# Patient Record
Sex: Male | Born: 1980 | Race: White | Hispanic: No | Marital: Single | Smoking: Never smoker
Health system: Southern US, Community
[De-identification: ages and names within clinical notes are randomized; demographics above are authoritative.]

## PROBLEM LIST (undated history)

## (undated) DIAGNOSIS — K449 Diaphragmatic hernia without obstruction or gangrene: Principal | ICD-10-CM

## (undated) DIAGNOSIS — I10 Essential (primary) hypertension: Secondary | ICD-10-CM

## (undated) DIAGNOSIS — T7840XA Allergy, unspecified, initial encounter: Secondary | ICD-10-CM

## (undated) DIAGNOSIS — K5090 Crohn's disease, unspecified, without complications: Secondary | ICD-10-CM

## (undated) HISTORY — DX: Allergy, unspecified, initial encounter: T78.40XA

## (undated) HISTORY — PX: OTHER SURGICAL HISTORY: SHX169

## (undated) HISTORY — DX: Essential (primary) hypertension: I10

## (undated) HISTORY — DX: Diaphragmatic hernia without obstruction or gangrene: K44.9

---

## 2016-06-15 ENCOUNTER — Encounter (HOSPITAL_COMMUNITY): Payer: Self-pay | Admitting: Emergency Medicine

## 2016-06-15 ENCOUNTER — Observation Stay (HOSPITAL_COMMUNITY)
Admission: EM | Admit: 2016-06-15 | Discharge: 2016-06-18 | Disposition: A | Discharge status: Home / Self Care | Payer: BLUE CROSS/BLUE SHIELD | Attending: Internal Medicine | Admitting: Internal Medicine

## 2016-06-15 DIAGNOSIS — K5090 Crohn's disease, unspecified, without complications: Secondary | ICD-10-CM | POA: Diagnosis present

## 2016-06-15 DIAGNOSIS — Z887 Allergy status to serum and vaccine status: Secondary | ICD-10-CM | POA: Diagnosis not present

## 2016-06-15 DIAGNOSIS — E860 Dehydration: Secondary | ICD-10-CM | POA: Diagnosis present

## 2016-06-15 DIAGNOSIS — R197 Diarrhea, unspecified: Secondary | ICD-10-CM

## 2016-06-15 DIAGNOSIS — Z888 Allergy status to other drugs, medicaments and biological substances status: Secondary | ICD-10-CM | POA: Diagnosis not present

## 2016-06-15 DIAGNOSIS — R1110 Vomiting, unspecified: Secondary | ICD-10-CM

## 2016-06-15 HISTORY — DX: Crohn's disease, unspecified, without complications: K50.90

## 2016-06-15 LAB — CBC WITH DIFFERENTIAL/PLATELET
BASOS PCT: 0 %
Basophils Absolute: 0 10*3/uL (ref 0.0–0.1)
Eosinophils Absolute: 0 10*3/uL (ref 0.0–0.7)
Eosinophils Relative: 0 %
HEMATOCRIT: 56.3 % — AB (ref 39.0–52.0)
HEMOGLOBIN: 20.1 g/dL — AB (ref 13.0–17.0)
Lymphocytes Relative: 7 %
Lymphs Abs: 0.7 10*3/uL (ref 0.7–4.0)
MCH: 31.4 pg (ref 26.0–34.0)
MCHC: 35.7 g/dL (ref 30.0–36.0)
MCV: 88 fL (ref 78.0–100.0)
MONOS PCT: 14 %
Monocytes Absolute: 1.3 10*3/uL — ABNORMAL HIGH (ref 0.1–1.0)
NEUTROS ABS: 7.6 10*3/uL (ref 1.7–7.7)
NEUTROS PCT: 79 %
Platelets: 264 10*3/uL (ref 150–400)
RBC: 6.4 MIL/uL — ABNORMAL HIGH (ref 4.22–5.81)
RDW: 13.5 % (ref 11.5–15.5)
WBC: 9.6 10*3/uL (ref 4.0–10.5)

## 2016-06-15 LAB — COMPREHENSIVE METABOLIC PANEL
ALBUMIN: 5.3 g/dL — AB (ref 3.5–5.0)
ALT: 27 U/L (ref 17–63)
AST: 21 U/L (ref 15–41)
Alkaline Phosphatase: 63 U/L (ref 38–126)
Anion gap: 10 (ref 5–15)
BILIRUBIN TOTAL: 1.4 mg/dL — AB (ref 0.3–1.2)
BUN: 24 mg/dL — AB (ref 6–20)
CALCIUM: 9.5 mg/dL (ref 8.9–10.3)
CO2: 27 mmol/L (ref 22–32)
CREATININE: 1.12 mg/dL (ref 0.61–1.24)
Chloride: 102 mmol/L (ref 101–111)
GFR calc Af Amer: >60 mL/min (ref >60)
GFR calc non Af Amer: >60 mL/min (ref >60)
GLUCOSE: 136 mg/dL — AB (ref 65–99)
Potassium: 4 mmol/L (ref 3.5–5.1)
Sodium: 139 mmol/L (ref 135–145)
TOTAL PROTEIN: 9.1 g/dL — AB (ref 6.5–8.1)

## 2016-06-15 LAB — LIPASE, BLOOD: Lipase: 14 U/L (ref 11–51)

## 2016-06-15 LAB — SEDIMENTATION RATE: Sed Rate: 3 mm/hr (ref 0–16)

## 2016-06-15 MED ORDER — PROMETHAZINE HCL 25 MG PO TABS: 25.0000 mg | ORAL_TABLET | Freq: Four times a day (QID) | ORAL | 0 refills | Status: DC | PRN

## 2016-06-15 MED ORDER — SODIUM CHLORIDE 0.9 % IV BOLUS (SEPSIS)
1000.0000 mL | Freq: Once | INTRAVENOUS | Status: AC
  Administered 2016-06-16: 1000 mL via INTRAVENOUS

## 2016-06-15 MED ORDER — SODIUM CHLORIDE 0.9 % IV BOLUS (SEPSIS)
1000.0000 mL | Freq: Once | INTRAVENOUS | Status: AC
  Administered 2016-06-15: 1000 mL via INTRAVENOUS

## 2016-06-15 MED ORDER — PROMETHAZINE HCL 25 MG/ML IJ SOLN
25.0000 mg | Freq: Once | INTRAMUSCULAR | Status: AC
  Administered 2016-06-16: 25 mg via INTRAVENOUS
  Filled 2016-06-15: qty 1

## 2016-06-15 MED ORDER — ONDANSETRON HCL 4 MG/2ML IJ SOLN
4.0000 mg | Freq: Once | INTRAMUSCULAR | Status: AC
  Administered 2016-06-15: 4 mg via INTRAVENOUS
  Filled 2016-06-15: qty 2

## 2016-06-15 NOTE — ED Provider Notes (Signed)
[facility] DEPT Provider Note   CSN: [id number] Arrival date & time: 06/15/16  2027  By signing my name below, I, Neta Mends, attest that this documentation has been prepared under the direction and in the presence of Merrily Pew, MD . Electronically Signed: Neta Mends, ED Scribe. 06/15/2016. 11:55 PM.    History   Chief Complaint Chief Complaint  Patient presents with  . Emesis  . Diarrhea    The history is provided by the patient. No language interpreter was used.   HPI Comments:  Collin Coleman is a 36 y.o. male with PMHx of Crohn's disease who presents to the Emergency Department complaining of multiple episodes of vomiting/diarrhea x 4 days. Pt reports that 5 days ago he ate 4 [employer] cakes which gave him increased gas and diarrhea, and he ate 2 [employer] chicken sandwiches 2 days ago, and then the diarrhea worsened, and since yesterday all of his BMs have been pure liquid. He states that today he has been unable to eat without vomiting. He reports that having diarrhea often is normal due to his Crohn's disease, but that it has become more severe over the past several day. Pt complains of associated abdominal pain and distension that he states precedes the vomiting. Pt reports that he feels a subjective fever after vomiting. Pt has taken Zofran, Gas-X, and drank pedialyte and water without relief. Mom states that pt did not have enough saliva in his mouth to dissolve the Zofran. He states that it has been 16 years since his last significant Crohn's flare. Pt states that the only medication he has taken for Crohn's was prednisone and this was in 2001. Pt denies any other significant medical problems. Pt denies other associated symptoms.   Past Medical History:  Diagnosis Date  . Crohn disease ([place])     There are no active problems to display for this patient.   History reviewed. No pertinent surgical history.     Home Medications    Prior to Admission  medications   Medication Sig Start Date End Date Taking? Authorizing Provider  promethazine (PHENERGAN) 25 MG tablet Take 1 tablet (25 mg total) by mouth every 6 (six) hours as needed for nausea or vomiting. 06/15/16   Merrily Pew, MD    Family History History reviewed. No pertinent family history.  Social History Social History  Substance Use Topics  . Smoking status: Never Smoker  . Smokeless tobacco: Never Used  . Alcohol use No     Allergies   Benadryl [diphenhydramine] and Pertussis vaccines   Review of Systems Review of Systems  Constitutional: Positive for diaphoresis.  Gastrointestinal: Positive for abdominal distention, abdominal pain, diarrhea, nausea and vomiting.  All other systems reviewed and are negative.    Physical Exam Updated Vital Signs BP 143/95 (BP Location: Left Arm)   Pulse 119   Temp 99 F (37.2 C) (Oral)   Resp 18   Ht _0  (1.676 m)   Wt 175 lb (79.4 kg)   SpO2 95%   BMI 28.25 kg/m   Physical Exam  Constitutional: He is oriented to person, place, and time. He appears well-developed and well-nourished. No distress.  HENT:  Head: Normocephalic and atraumatic.  Eyes: Conjunctivae are normal.  Neck: Normal range of motion.  Cardiovascular: Regular rhythm and normal heart sounds.  Tachycardia present.  Exam reveals no gallop and no friction rub.   No murmur heard. Tachycardia  Pulmonary/Chest: Effort normal and breath sounds normal. No respiratory  distress. He has no wheezes. He has no rales. He exhibits no tenderness.  Abdominal: Bowel sounds are normal. He exhibits distension (mild). There is no rebound and no guarding.  Musculoskeletal: Normal range of motion. He exhibits no edema or deformity.  Neurological: He is alert and oriented to person, place, and time.  Skin: Skin is warm and dry. There is pallor.  Psychiatric: He has a normal mood and affect.  Nursing note and vitals reviewed.    ED Treatments / Results  DIAGNOSTIC  STUDIES:  Oxygen Saturation is 95% on RA, normal by my interpretation.    COORDINATION OF CARE:  9:32 PM Will provide Zofran and phenergan. Discussed treatment plan with pt at bedside and pt agreed to plan.   Labs (all labs ordered are listed, but only abnormal results are displayed) Labs Reviewed  CBC WITH DIFFERENTIAL/PLATELET - Abnormal; Notable for the following:       Result Value   RBC 6.40 (*)    Hemoglobin 20.1 (*)    HCT 56.3 (*)    Monocytes Absolute 1.3 (*)    All other components within normal limits  COMPREHENSIVE METABOLIC PANEL - Abnormal; Notable for the following:    Glucose, Bld 136 (*)    BUN 24 (*)    Total Protein 9.1 (*)    Albumin 5.3 (*)    Total Bilirubin 1.4 (*)    All other components within normal limits  SEDIMENTATION RATE  LIPASE, BLOOD  URINALYSIS, ROUTINE W REFLEX MICROSCOPIC    EKG  EKG Interpretation None       Radiology No results found.  Procedures Procedures (including critical care time)  Medications Ordered in ED Medications  promethazine (PHENERGAN) injection 25 mg (not administered)  sodium chloride 0.9 % bolus 1,000 mL (not administered)  sodium chloride 0.9 % bolus 1,000 mL (1,000 mLs Intravenous New Bag/Given 06/15/16 2157)  ondansetron [facility]) injection 4 mg (4 mg Intravenous Given 06/15/16 2157)     Initial Impression / Assessment and Plan / ED Course  I have reviewed the triage vital signs and the nursing notes.  Pertinent labs & imaging results that were available during my care of the patient were reviewed by me and considered in my medical decision making (see chart for details).     Dehydration likely 2/2 gastroenteritis. Doubt crohn's flare. No blood in diarrhea. No abdominal pain. No fevers. Normal wbc and esr.  Plan for hydration, vomiting control.   Final Clinical Impressions(s) / ED Diagnoses   Final diagnoses:  Dehydration  Vomiting, intractability of vomiting not specified, presence of nausea  not specified, unspecified vomiting type  Diarrhea, unspecified type    New Prescriptions New Prescriptions   PROMETHAZINE (PHENERGAN) 25 MG TABLET    Take 1 tablet (25 mg total) by mouth every 6 (six) hours as needed for nausea or vomiting.   I personally performed the services described in this documentation, which was scribed in my presence. The recorded information has been reviewed and is accurate.     Merrily Pew, MD 06/15/16 [phone]

## 2016-06-15 NOTE — ED Triage Notes (Signed)
Pt states that he has been having vomiting and diarrhea since Friday.  Has history of crohns

## 2016-06-15 NOTE — ED Notes (Signed)
Pt given water to drink. Pt experiencing some nausea but no vomiting at this time.

## 2016-06-15 NOTE — ED Notes (Signed)
Pt states he is unable to provide urine sample. States that he is "severely dehydrated." Pt requesting ice chips, and this tech was told by RN Cecille Rubin to provide them. Pt is now eating a few ice chips, and was informed to let this tech know when he is able to provide urine sample.

## 2016-06-15 NOTE — ED Notes (Addendum)
Pt given water by Dr. Dayna Barker

## 2016-06-16 ENCOUNTER — Emergency Department (HOSPITAL_COMMUNITY): Payer: BLUE CROSS/BLUE SHIELD

## 2016-06-16 ENCOUNTER — Encounter (HOSPITAL_COMMUNITY): Payer: Self-pay | Admitting: Family Medicine

## 2016-06-16 DIAGNOSIS — K5090 Crohn's disease, unspecified, without complications: Secondary | ICD-10-CM | POA: Diagnosis not present

## 2016-06-16 DIAGNOSIS — R197 Diarrhea, unspecified: Secondary | ICD-10-CM

## 2016-06-16 DIAGNOSIS — R1110 Vomiting, unspecified: Secondary | ICD-10-CM

## 2016-06-16 DIAGNOSIS — E860 Dehydration: Secondary | ICD-10-CM | POA: Diagnosis not present

## 2016-06-16 LAB — BASIC METABOLIC PANEL
ANION GAP: 6 (ref 5–15)
BUN: 16 mg/dL (ref 6–20)
CALCIUM: 7.6 mg/dL — AB (ref 8.9–10.3)
CO2: 23 mmol/L (ref 22–32)
CREATININE: 0.86 mg/dL (ref 0.61–1.24)
Chloride: 106 mmol/L (ref 101–111)
Glucose, Bld: 126 mg/dL — ABNORMAL HIGH (ref 65–99)
Potassium: 3.9 mmol/L (ref 3.5–5.1)
SODIUM: 135 mmol/L (ref 135–145)

## 2016-06-16 LAB — URINALYSIS, ROUTINE W REFLEX MICROSCOPIC
BACTERIA UA: NONE SEEN
BILIRUBIN URINE: NEGATIVE
Glucose, UA: NEGATIVE mg/dL
HGB URINE DIPSTICK: NEGATIVE
Ketones, ur: 5 mg/dL — AB
LEUKOCYTES UA: NEGATIVE
Nitrite: NEGATIVE
PROTEIN: 30 mg/dL — AB
SPECIFIC GRAVITY, URINE: 1.027 (ref 1.005–1.030)
pH: 5 (ref 5.0–8.0)

## 2016-06-16 LAB — CBC
HCT: 46.9 % (ref 39.0–52.0)
HEMOGLOBIN: 16.2 g/dL (ref 13.0–17.0)
MCH: 30.6 pg (ref 26.0–34.0)
MCHC: 34.5 g/dL (ref 30.0–36.0)
MCV: 88.5 fL (ref 78.0–100.0)
PLATELETS: 213 10*3/uL (ref 150–400)
RBC: 5.3 MIL/uL (ref 4.22–5.81)
RDW: 13.5 % (ref 11.5–15.5)
WBC: 5.8 10*3/uL (ref 4.0–10.5)

## 2016-06-16 MED ORDER — IOPAMIDOL (ISOVUE-300) INJECTION 61%: 100.0000 mL | Freq: Once | INTRAVENOUS | Status: DC | PRN

## 2016-06-16 MED ORDER — ONDANSETRON HCL 4 MG/2ML IJ SOLN: 4.0000 mg | Freq: Four times a day (QID) | INTRAMUSCULAR | Status: DC | PRN

## 2016-06-16 MED ORDER — METHYLPREDNISOLONE SODIUM SUCC 125 MG IJ SOLR
80.0000 mg | Freq: Once | INTRAMUSCULAR | Status: AC
  Administered 2016-06-16: 80 mg via INTRAVENOUS
  Filled 2016-06-16: qty 2

## 2016-06-16 MED ORDER — SODIUM CHLORIDE 0.9 % IV SOLN: INTRAVENOUS | Status: DC

## 2016-06-16 MED ORDER — METHYLPREDNISOLONE SODIUM SUCC 125 MG IJ SOLR
80.0000 mg | Freq: Two times a day (BID) | INTRAMUSCULAR | Status: DC
  Administered 2016-06-16 – 2016-06-17 (×3): 80 mg via INTRAVENOUS
  Filled 2016-06-16 (×3): qty 2

## 2016-06-16 MED ORDER — ONDANSETRON HCL 4 MG PO TABS: 4.0000 mg | ORAL_TABLET | Freq: Four times a day (QID) | ORAL | Status: DC | PRN

## 2016-06-16 MED ORDER — SODIUM CHLORIDE 0.9 % IV BOLUS (SEPSIS)
1000.0000 mL | Freq: Once | INTRAVENOUS | Status: AC
  Administered 2016-06-16: 1000 mL via INTRAVENOUS

## 2016-06-16 MED ORDER — KETOROLAC TROMETHAMINE 30 MG/ML IJ SOLN: 30.0000 mg | Freq: Four times a day (QID) | INTRAMUSCULAR | Status: DC | PRN

## 2016-06-16 MED ORDER — IOPAMIDOL (ISOVUE-300) INJECTION 61%
INTRAVENOUS | Status: AC
  Administered 2016-06-16: 100 mL
  Filled 2016-06-16: qty 30

## 2016-06-16 MED ORDER — SODIUM CHLORIDE 0.9 % IV SOLN
INTRAVENOUS | Status: AC
  Administered 2016-06-16 (×2): via INTRAVENOUS

## 2016-06-16 MED ORDER — PROMETHAZINE HCL 25 MG/ML IJ SOLN
12.5000 mg | Freq: Once | INTRAMUSCULAR | Status: AC
  Administered 2016-06-16: 12.5 mg via INTRAVENOUS
  Filled 2016-06-16: qty 1

## 2016-06-16 NOTE — H&P (Signed)
History and Physical    Collin Coleman [id number] DOB: 1980-08-31 DOA: 06/15/2016  PCP: No PCP Per Patient  Patient coming from: home  Chief Complaint:  Abdominal pain, n/v/d  HPI: Collin Coleman is a 36 y.o. male with medical history significant of  crohns disease diagnosed at age 19 years old , has not had a flare in over 16 years comes in with 5 days of progressive worsening abdominal cramps, diarrhea with vomiting.  He denies any fevers.  He has chronic diarrhea, but this volume has increased a lot with more cramping than usual.  He has had decreased po intake.  He has not been on steroids in over 16 years.  He follows a strict diet that he believes helps him a lot.  Pt found to have  crohns flare on ct scan and referred for admission for iv steroids.  Review of Systems: As per HPI otherwise 10 point review of systems negative.   Past Medical History:  Diagnosis Date  . Crohn disease ([place])     History reviewed. No pertinent surgical history.   reports that he has never smoked. He has never used smokeless tobacco. He reports that he does not drink alcohol or use drugs.  Allergies  Allergen Reactions  . Benadryl [Diphenhydramine]   . Pertussis Vaccines     History reviewed. No pertinent family history. no CAD  Prior to Admission medications   Medication Sig Start Date End Date Taking? Authorizing Provider  promethazine (PHENERGAN) 25 MG tablet Take 1 tablet (25 mg total) by mouth every 6 (six) hours as needed for nausea or vomiting. 06/15/16   Merrily Pew, MD    Physical Exam: Vitals:   06/15/16 2043 06/16/16 0039 06/16/16 0159 06/16/16 0449  BP:  137/95  129/81  Pulse:  106  90  Resp:  18  17  Temp:   98.9 F (37.2 C)   TempSrc:      SpO2:  98%  98%  Weight: 79.4 kg (175 lb)     Height: 5\' 6"  (1.676 m)      Constitutional: NAD, calm, comfortable Vitals:   06/15/16 2043 06/16/16 0039 06/16/16 0159 06/16/16 0449  BP:  137/95  129/81  Pulse:  106  90  Resp:  18  17    Temp:   98.9 F (37.2 C)   TempSrc:      SpO2:  98%  98%  Weight: 79.4 kg (175 lb)     Height: 5\' 6"  (1.676 m)      Eyes: PERRL, lids and conjunctivae normal ENMT: Mucous membranes are moist. Posterior pharynx clear of any exudate or lesions.Normal dentition.  Neck: normal, supple, no masses, no thyromegaly Respiratory: clear to auscultation bilaterally, no wheezing, no crackles. Normal respiratory effort. No accessory muscle use.  Cardiovascular: Regular rate and rhythm, no murmurs / rubs / gallops. No extremity edema. 2+ pedal pulses. No carotid bruits.  Abdomen: no tenderness, no masses palpated. No hepatosplenomegaly. Bowel sounds positive.  Musculoskeletal: no clubbing / cyanosis. No joint deformity upper and lower extremities. Good ROM, no contractures. Normal muscle tone.  Skin: no rashes, lesions, ulcers. No induration Neurologic: CN 2-12 grossly intact. Sensation intact, DTR normal. Strength 5/5 in all 4.  Psychiatric: Normal judgment and insight. Alert and oriented x 3. Normal mood.    Labs on Admission: I have personally reviewed following labs and imaging studies  CBC:  Recent Labs Lab 06/15/16 2157  WBC 9.6  NEUTROABS 7.6  HGB 20.1*  HCT 56.3*  MCV 88.0  PLT [id number]   Basic Metabolic Panel:  Recent Labs Lab 06/15/16 2157  NA 139  K 4.0  CL 102  CO2 27  GLUCOSE 136*  BUN 24*  CREATININE 1.12  CALCIUM 9.5   GFR: Estimated Creatinine Clearance: 89.4 mL/min (by C-G formula based on SCr of 1.12 mg/dL). Liver Function Tests:  Recent Labs Lab 06/15/16 2157  AST 21  ALT 27  ALKPHOS 63  BILITOT 1.4*  PROT 9.1*  ALBUMIN 5.3*    Recent Labs Lab 06/15/16 2157  LIPASE 14   Urine analysis:    Component Value Date/Time   COLORURINE YELLOW 06/16/2016 0100   APPEARANCEUR HAZY (A) 06/16/2016 0100   LABSPEC 1.027 06/16/2016 0100   PHURINE 5.0 06/16/2016 0100   GLUCOSEU NEGATIVE 06/16/2016 0100   HGBUR NEGATIVE 06/16/2016 0100   BILIRUBINUR NEGATIVE  06/16/2016 0100   KETONESUR 5 (A) 06/16/2016 0100   PROTEINUR 30 (A) 06/16/2016 0100   NITRITE NEGATIVE 06/16/2016 0100   LEUKOCYTESUR NEGATIVE 06/16/2016 0100   Radiological Exams on Admission: Ct Abdomen Pelvis W Contrast  Result Date: 06/16/2016 CLINICAL DATA:  Initial evaluation for acute right lower quadrant pain. History of Crohn's disease. EXAM: CT ABDOMEN AND PELVIS WITH CONTRAST TECHNIQUE: Multidetector CT imaging of the abdomen and pelvis was performed using the standard protocol following bolus administration of intravenous contrast. CONTRAST:  179mL ISOVUE-300 IOPAMIDOL (ISOVUE-300) INJECTION 61% COMPARISON:  None available. FINDINGS: Lower chest: Minimal subsegmental atelectasis present dependently within the lung bases. Visualized lungs are otherwise clear. Hepatobiliary: Liver within normal limits. Gallbladder normal. No biliary dilatation. Pancreas: Pancreas within normal limits. Spleen: Spleen within normal limits. Adrenals/Urinary Tract: Adrenal glands are normal. Subcentimeter hypodensity within the lower pole left kidney noted, too small the characterize, but statistically likely reflects a small cyst. Kidneys otherwise unremarkable without evidence for nephrolithiasis, hydronephrosis, or focal enhancing renal mass. No hydroureter. Bladder within normal limits. Stomach/Bowel: Small amount of enteric contrast material present within the distal esophageal lumen. Stomach within normal limits. Multiple mildly prominent fluid-filled loops of small bowel seen scattered throughout the abdomen without evidence for obstruction. There is abnormal wall thickening with mucosal enhancement inflammatory stranding about a short segment of the terminal ileum as it courses into the ileocecal valve (series 5, image 44). Findings compatible with acute enteritis, most likely related to an acute Crohn's flare. Appendix is well-visualized posteriorly and is normal in appearance without evidence for acute  appendicitis. Scattered intraluminal fluid density throughout the colon, likely related to diarrheal illness. No other significant inflammatory changes about the colon. Vascular/Lymphatic: Normal intravascular enhancement seen throughout the intra-abdominal aorta and its branch vessels. No adenopathy. Reproductive: Prostate normal. Other: No free intraperitoneal air.  No free fluid. Musculoskeletal: No acute osseous abnormality. Mild compression deformity of the T8 vertebral body noted, chronic in appearance. No worrisome lytic or blastic osseous lesions. IMPRESSION: 1. Wall thickening with mucosal enhancement and inflammatory stranding about the terminal ileum, most consistent with acute enteritis. This is most likely inflammatory in nature related to an acute Crohn's flare. No complication identified. 2. Intraluminal fluid density throughout the colon, compatible with associated/concomitant diarrheal illness. 3. Remote compression deformity of the T8 vertebral body. Electronically Signed   By: Jeannine Boga M.D.   On: 06/16/2016 04:18    Assessment/Plan 36 yo male with acute crohns flare and dehydration  Principal Problem:   Crohn disease ([place])- iv solumedrol 80mg  iv q 12 hours while nauseas, advance and switch to po once tolerating po  well  Active Problems:   Dehydration- ivf    DVT prophylaxis: scds  Code Status:  full Family Communication: none  Disposition Plan:  Per day team Consults called:  none Admission status:  observation   Czarina Gingras A MD [facility]  If 7PM-7AM, please contact night-coverage [email or web address] Password [facility]  06/16/2016, 4:51 AM

## 2016-06-16 NOTE — ED Notes (Signed)
[place]-mother-[phone]   Call mother to advise what time pt may be admitted.

## 2016-06-16 NOTE — Progress Notes (Signed)
06/16/16  0900  Patient arrived to the floor A&O x 4, in stable condition, and IV saline lock.

## 2016-06-16 NOTE — ED Notes (Signed)
Pt requested and given ice chips

## 2016-06-16 NOTE — ED Notes (Signed)
Pt and family updated, tolerating po fluids well,

## 2016-06-16 NOTE — ED Provider Notes (Signed)
Assumed care from Dr. Dayna Barker at [facility].  Patient with hx Crohn's, not on medications, presenting with 4 days of vomiting and diarrhea.  No fever. Unable to tolerate PO today.  Receiving IV and PO hydration.  Patient with persistent pain and nausea and dry heaving as well as RLQ pain. CT scan obtained which shows ileitis likely consistent with Crohn's exacerbation. We'll start steroids.  Patient still nauseated and dry heaving and doesn't feel like he can go home. Observation admission discussed with Dr. Shanon Brow.    Ezequiel Essex, MD 06/16/16 [phone]

## 2016-06-16 NOTE — Progress Notes (Signed)
Seen and examined. Agree with Dr. Camelia Eng admission note.  Will continue to monitor.  Patient asking for soft diet (particularly would like chocolate pudding or pasta).  Soft diet placed.

## 2016-06-17 DIAGNOSIS — R112 Nausea with vomiting, unspecified: Secondary | ICD-10-CM | POA: Diagnosis not present

## 2016-06-17 DIAGNOSIS — E860 Dehydration: Secondary | ICD-10-CM | POA: Diagnosis not present

## 2016-06-17 DIAGNOSIS — K5090 Crohn's disease, unspecified, without complications: Secondary | ICD-10-CM | POA: Diagnosis not present

## 2016-06-17 MED ORDER — PREDNISONE 20 MG PO TABS
40.0000 mg | ORAL_TABLET | Freq: Every day | ORAL | Status: DC
  Administered 2016-06-18: 40 mg via ORAL
  Filled 2016-06-17: qty 2

## 2016-06-17 NOTE — Progress Notes (Signed)
PROGRESS NOTE    Collin Coleman  [id number] DOB: 07-28-1980 DOA: 06/15/2016 PCP: No PCP Per Patient   Brief Narrative:  Collin Coleman is a 36 y.o. male with medical history significant of  crohns disease diagnosed at age 50 years old , has not had a flare in over 16 years comes in with 5 days of progressive worsening abdominal cramps, diarrhea with vomiting.  He denies any fevers.  He has chronic diarrhea, but this volume has increased a lot with more cramping than usual.  He has had decreased po intake.  He has not been on steroids in over 16 years.  He follows a strict diet that he believes helps him a lot.  Pt found to have  crohns flare on ct scan and referred for admission for iv steroids.   Assessment & Plan:   Principal Problem:   Crohn's disease without complication ([place]) Active Problems:   Dehydration   Vomiting   Crohn disease ([place]) - iv solumedrol 80mg  iv q 12 hours to transition to PO tomorrow - patient tolerated soft diet - will advance to regular diet as patient would like pasta  Dehydration - d/c IVF as patient is tolerating PO currently   DVT prophylaxis: scds  Code Status:  full Family Communication: none  Disposition Plan:  likely d/c in am   Consultants:   none  Procedures:   none  Antimicrobials:   none    Subjective: Patient reports some minimal abdominal pain overnight but says he was able to eat breakfast this am without problem.  He feels like he is willing to try a regular diet.  Discussed transitioning over to PO steroids and he feels like he is concerned about tolerating the diet and also tolerating his medication.  Objective: Vitals:   06/16/16 0900 06/16/16 1546 06/16/16 2100 06/17/16 0500  BP:  120/75 128/80 121/72  Pulse:  84 68 80  Resp: 20 20 18 18   Temp: 98.6 F (37 C) 98 F (36.7 C) 98.5 F (36.9 C) 97.6 F (36.4 C)  TempSrc: Oral Oral Oral Axillary  SpO2: 98% 99% 99% 97%  Weight: 77.1 kg (170 lb)     Height: 5\' 6"   (1.676 m)       Intake/Output Summary (Last 24 hours) at 06/17/16 1441 Last data filed at 06/17/16 1300  Gross per 24 hour  Intake             1730 ml  Output                0 ml  Net             1730 ml   Filed Weights   06/15/16 2043 06/16/16 0900  Weight: 79.4 kg (175 lb) 77.1 kg (170 lb)    Examination:  General exam: Appears calm and comfortable  Respiratory system: Clear to auscultation. Respiratory effort normal. Cardiovascular system: S1 & S2 heard, RRR. No JVD, murmurs, rubs, gallops or clicks. No pedal edema. Gastrointestinal system: Abdomen is nondistended, soft and nontender. No organomegaly or masses felt. Normal bowel sounds heard. Central nervous system: Alert and oriented. No focal neurological deficits. Extremities: Symmetric 5 x 5 power. Skin: No rashes, lesions or ulcers Psychiatry: Judgement and insight appear normal. Mood & affect appropriate.     Data Reviewed: I have personally reviewed following labs and imaging studies  CBC:  Recent Labs Lab 06/15/16 2157 06/16/16 0858  WBC 9.6 5.8  NEUTROABS 7.6  --   HGB  20.1* 16.2  HCT 56.3* 46.9  MCV 88.0 88.5  PLT 264 [id number]   Basic Metabolic Panel:  Recent Labs Lab 06/15/16 2157 06/16/16 0858  NA 139 135  K 4.0 3.9  CL 102 106  CO2 27 23  GLUCOSE 136* 126*  BUN 24* 16  CREATININE 1.12 0.86  CALCIUM 9.5 7.6*   GFR: Estimated Creatinine Clearance: 114.9 mL/min (by C-G formula based on SCr of 0.86 mg/dL). Liver Function Tests:  Recent Labs Lab 06/15/16 2157  AST 21  ALT 27  ALKPHOS 63  BILITOT 1.4*  PROT 9.1*  ALBUMIN 5.3*    Recent Labs Lab 06/15/16 2157  LIPASE 14   No results for input(s): AMMONIA in the last 168 hours. Coagulation Profile: No results for input(s): INR, PROTIME in the last 168 hours. Cardiac Enzymes: No results for input(s): CKTOTAL, CKMB, CKMBINDEX, TROPONINI in the last 168 hours. BNP (last 3 results) No results for input(s): PROBNP in the last 8760  hours. HbA1C: No results for input(s): HGBA1C in the last 72 hours. CBG: No results for input(s): GLUCAP in the last 168 hours. Lipid Profile: No results for input(s): CHOL, HDL, LDLCALC, TRIG, CHOLHDL, LDLDIRECT in the last 72 hours. Thyroid Function Tests: No results for input(s): TSH, T4TOTAL, FREET4, T3FREE, THYROIDAB in the last 72 hours. Anemia Panel: No results for input(s): VITAMINB12, FOLATE, FERRITIN, TIBC, IRON, RETICCTPCT in the last 72 hours. Sepsis Labs: No results for input(s): PROCALCITON, LATICACIDVEN in the last 168 hours.  No results found for this or any previous visit (from the past 240 hour(s)).       Radiology Studies: Ct Abdomen Pelvis W Contrast  Result Date: 06/16/2016 CLINICAL DATA:  Initial evaluation for acute right lower quadrant pain. History of Crohn's disease. EXAM: CT ABDOMEN AND PELVIS WITH CONTRAST TECHNIQUE: Multidetector CT imaging of the abdomen and pelvis was performed using the standard protocol following bolus administration of intravenous contrast. CONTRAST:  126mL ISOVUE-300 IOPAMIDOL (ISOVUE-300) INJECTION 61% COMPARISON:  None available. FINDINGS: Lower chest: Minimal subsegmental atelectasis present dependently within the lung bases. Visualized lungs are otherwise clear. Hepatobiliary: Liver within normal limits. Gallbladder normal. No biliary dilatation. Pancreas: Pancreas within normal limits. Spleen: Spleen within normal limits. Adrenals/Urinary Tract: Adrenal glands are normal. Subcentimeter hypodensity within the lower pole left kidney noted, too small the characterize, but statistically likely reflects a small cyst. Kidneys otherwise unremarkable without evidence for nephrolithiasis, hydronephrosis, or focal enhancing renal mass. No hydroureter. Bladder within normal limits. Stomach/Bowel: Small amount of enteric contrast material present within the distal esophageal lumen. Stomach within normal limits. Multiple mildly prominent fluid-filled  loops of small bowel seen scattered throughout the abdomen without evidence for obstruction. There is abnormal wall thickening with mucosal enhancement inflammatory stranding about a short segment of the terminal ileum as it courses into the ileocecal valve (series 5, image 44). Findings compatible with acute enteritis, most likely related to an acute Crohn's flare. Appendix is well-visualized posteriorly and is normal in appearance without evidence for acute appendicitis. Scattered intraluminal fluid density throughout the colon, likely related to diarrheal illness. No other significant inflammatory changes about the colon. Vascular/Lymphatic: Normal intravascular enhancement seen throughout the intra-abdominal aorta and its branch vessels. No adenopathy. Reproductive: Prostate normal. Other: No free intraperitoneal air.  No free fluid. Musculoskeletal: No acute osseous abnormality. Mild compression deformity of the T8 vertebral body noted, chronic in appearance. No worrisome lytic or blastic osseous lesions. IMPRESSION: 1. Wall thickening with mucosal enhancement and inflammatory stranding about the terminal ileum,  most consistent with acute enteritis. This is most likely inflammatory in nature related to an acute Crohn's flare. No complication identified. 2. Intraluminal fluid density throughout the colon, compatible with associated/concomitant diarrheal illness. 3. Remote compression deformity of the T8 vertebral body. Electronically Signed   By: Jeannine Boga M.D.   On: 06/16/2016 04:18        Scheduled Meds: . [START ON 06/18/2016] predniSONE  40 mg Oral Q breakfast   Continuous Infusions:   LOS: 0 days    Time spent: 25 minutes    Loretha Stapler, MD [facility] Pager [phone]  If 7PM-7AM, please contact night-coverage [email or web address] Password [facility] 06/17/2016, 2:41 PM

## 2016-06-18 DIAGNOSIS — K5090 Crohn's disease, unspecified, without complications: Secondary | ICD-10-CM

## 2016-06-18 MED ORDER — PROMETHAZINE HCL 25 MG PO TABS: 25.0000 mg | ORAL_TABLET | Freq: Four times a day (QID) | ORAL | 0 refills | Status: DC | PRN

## 2016-06-18 MED ORDER — PREDNISONE 10 MG PO TABS: ORAL_TABLET | ORAL | 0 refills | Status: DC

## 2016-06-18 MED ORDER — PANTOPRAZOLE SODIUM 40 MG PO TBEC: 40.0000 mg | DELAYED_RELEASE_TABLET | Freq: Every day | ORAL | 1 refills | Status: DC

## 2016-06-18 NOTE — Discharge Instructions (Signed)
Follow with GI in 2 weeks  Get CBC, CMP,  checked  by Primary MD next visit.    Activity: As tolerated   Disposition Home   Diet: Low-residue, soft diet   On your next visit with your primary care physician please Get Medicines reviewed and adjusted.   Please request your Prim.MD to go over all Hospital Tests and Procedure/Radiological results at the follow up, please get all Hospital records sent to your Prim MD by signing hospital release before you go home.   If you experience worsening of your admission symptoms, develop shortness of breath, life threatening emergency, suicidal or homicidal thoughts you must seek medical attention immediately by calling 911 or calling your MD immediately  if symptoms less severe.  You Must read complete instructions/literature along with all the possible adverse reactions/side effects for all the Medicines you take and that have been prescribed to you. Take any new Medicines after you have completely understood and accpet all the possible adverse reactions/side effects.   Do not drive, operating heavy machinery, perform activities at heights, swimming or participation in water activities or provide baby sitting services if your were admitted for syncope or siezures until you have seen by Primary MD or a Neurologist and advised to do so again.  Do not drive when taking Pain medications.    Do not take more than prescribed Pain, Sleep and Anxiety Medications  Special Instructions: If you have smoked or chewed Tobacco  in the last 2 yrs please stop smoking, stop any regular Alcohol  and or any Recreational drug use.  Wear Seat belts while driving.   Please note  You were cared for by a hospitalist during your hospital stay. If you have any questions about your discharge medications or the care you received while you were in the hospital after you are discharged, you can call the unit and asked to speak with the hospitalist on call if the  hospitalist that took care of you is not available. Once you are discharged, your primary care physician will handle any further medical issues. Please note that NO REFILLS for any discharge medications will be authorized once you are discharged, as it is imperative that you return to your primary care physician (or establish a relationship with a primary care physician if you do not have one) for your aftercare needs so that they can reassess your need for medications and monitor your lab values.

## 2016-06-18 NOTE — Discharge Summary (Signed)
Collin Coleman, is a 36 y.o. male  DOB Apr 06, 1981  MRN [id number].  Admission date:  06/15/2016  Admitting Physician  Phillips Grout, MD  Discharge Date:  06/18/2016   Primary MD  No PCP Per Patient  Recommendations for primary care physician for things to follow:  - Patient to follow with GI , Dr. Laural Golden will schedule an appointment in 2 weeks from discharge.   Admission Diagnosis  Dehydration [E86.0] Exacerbation of Crohn's disease without complication (HCC) XX123456 Diarrhea, unspecified type [R19.7] Vomiting, intractability of vomiting not specified, presence of nausea not specified, unspecified vomiting type [R11.10] Crohn's disease without complication, unspecified gastrointestinal tract location [facility]) [K50.90]   Discharge Diagnosis  Dehydration [E86.0] Exacerbation of Crohn's disease without complication ([place]) XX123456 Diarrhea, unspecified type [R19.7] Vomiting, intractability of vomiting not specified, presence of nausea not specified, unspecified vomiting type [R11.10] Crohn's disease without complication, unspecified gastrointestinal tract location ([place]) [K50.90]    Principal Problem:   Crohn's disease without complication ([place]) Active Problems:   Dehydration   Vomiting      Past Medical History:  Diagnosis Date  . Crohn disease ([place])     History reviewed. No pertinent surgical history.     History of present illness and  Hospital Course:     Kindly see H&P for history of present illness and admission details, please review complete Labs, Consult reports and Test reports for all details in brief  HPI  from the history and physical done on the day of admission 06/16/2016 HPI: Collin Coleman is a 36 y.o. male with medical history significant of  crohns disease diagnosed at age 31 years old , has not had a flare in over 16 years comes in with 5 days of progressive worsening abdominal  cramps, diarrhea with vomiting.  He denies any fevers.  He has chronic diarrhea, but this volume has increased a lot with more cramping than usual.  He has had decreased po intake.  He has not been on steroids in over 16 years.  He follows a strict diet that he believes helps him a lot.  Pt found to have  crohns flare on ct scan and referred for admission for iv steroids.   Hospital Course   Crohn disease [facility]) - Patient reports last flare 16 years ago, treated with IV Solu-Medrol during hospital stay, tolerated by mouth diet good, so he was advanced, tolerating by mouth date of her last 24 hours, denies any nausea or vomiting, abdominal pain significantly subsided, no further diarrhea, most recent bowel movement was Monday evening, he is more concerned about constipation than diarrhea currently, benign abdominal exam, transitioned to by mouth prednisone today 40 mg oral daily, with recommendation for gradual taper by 5 mg every week, discussed with Dr. Melony Overly, patient to start following with him as an outpatient.   Dehydration - Resolve with IV fluids, tolerating by mouth intake.     Discharge Condition:  Stable   Follow UP  Follow-up Information    Hildred Laser, MD Follow  up.   Specialty:  Gastroenterology Why:  You will be called with an appointment in 2 weeks Contact information: [place] [address] [phone]             Discharge Instructions  and  Discharge Medications     Allergies as of 06/18/2016      Reactions   Benadryl [diphenhydramine]    Pertussis Vaccines       Medication List    TAKE these medications   pantoprazole 40 MG tablet Commonly known as:  PROTONIX Take 1 tablet (40 mg total) by mouth daily.   predniSONE 10 MG tablet Commonly known as:  DELTASONE Please take 40 mg oral daily for 1 week, then taper by 5 mg every week, until you are seen by GI physician regarding further recommendation   promethazine 25 MG  tablet Commonly known as:  PHENERGAN Take 1 tablet (25 mg total) by mouth every 6 (six) hours as needed for nausea or vomiting.   Vitamin D (Ergocalciferol) 50000 units Caps capsule Commonly known as:  DRISDOL Take 50,000 Units by mouth 2 (two) times a week.         Diet and Activity recommendation: See Discharge Instructions above   Consults obtained -  None  Major procedures and Radiology Reports - PLEASE review detailed and final reports for all details, in brief -     Ct Abdomen Pelvis W Contrast  Result Date: 06/16/2016 CLINICAL DATA:  Initial evaluation for acute right lower quadrant pain. History of Crohn's disease. EXAM: CT ABDOMEN AND PELVIS WITH CONTRAST TECHNIQUE: Multidetector CT imaging of the abdomen and pelvis was performed using the standard protocol following bolus administration of intravenous contrast. CONTRAST:  15mL ISOVUE-300 IOPAMIDOL (ISOVUE-300) INJECTION 61% COMPARISON:  None available. FINDINGS: Lower chest: Minimal subsegmental atelectasis present dependently within the lung bases. Visualized lungs are otherwise clear. Hepatobiliary: Liver within normal limits. Gallbladder normal. No biliary dilatation. Pancreas: Pancreas within normal limits. Spleen: Spleen within normal limits. Adrenals/Urinary Tract: Adrenal glands are normal. Subcentimeter hypodensity within the lower pole left kidney noted, too small the characterize, but statistically likely reflects a small cyst. Kidneys otherwise unremarkable without evidence for nephrolithiasis, hydronephrosis, or focal enhancing renal mass. No hydroureter. Bladder within normal limits. Stomach/Bowel: Small amount of enteric contrast material present within the distal esophageal lumen. Stomach within normal limits. Multiple mildly prominent fluid-filled loops of small bowel seen scattered throughout the abdomen without evidence for obstruction. There is abnormal wall thickening with mucosal enhancement inflammatory  stranding about a short segment of the terminal ileum as it courses into the ileocecal valve (series 5, image 44). Findings compatible with acute enteritis, most likely related to an acute Crohn's flare. Appendix is well-visualized posteriorly and is normal in appearance without evidence for acute appendicitis. Scattered intraluminal fluid density throughout the colon, likely related to diarrheal illness. No other significant inflammatory changes about the colon. Vascular/Lymphatic: Normal intravascular enhancement seen throughout the intra-abdominal aorta and its branch vessels. No adenopathy. Reproductive: Prostate normal. Other: No free intraperitoneal air.  No free fluid. Musculoskeletal: No acute osseous abnormality. Mild compression deformity of the T8 vertebral body noted, chronic in appearance. No worrisome lytic or blastic osseous lesions. IMPRESSION: 1. Wall thickening with mucosal enhancement and inflammatory stranding about the terminal ileum, most consistent with acute enteritis. This is most likely inflammatory in nature related to an acute Crohn's flare. No complication identified. 2. Intraluminal fluid density throughout the colon, compatible with associated/concomitant diarrheal illness. 3. Remote  compression deformity of the T8 vertebral body. Electronically Signed   By: Jeannine Boga M.D.   On: 06/16/2016 04:18    Micro Results    No results found for this or any previous visit (from the past 240 hour(s)).     Today   Subjective:   Collin Coleman today has no headache,no chest  pain,no new weakness tingling or numbness, feels much better  Today. No further diarrhea, last bowel movement was Monday evening, reports abdominal pain significantly subsided, he is tolerating soft diet  Objective:   Blood pressure 99/60, pulse (!) 50, temperature 97.9 F (36.6 C), temperature source Oral, resp. rate 15, height 5\' 6"  (1.676 m), weight 77.1 kg (170 lb), SpO2 98 %.   Intake/Output  Summary (Last 24 hours) at 06/18/16 1320 Last data filed at 06/18/16 0700  Gross per 24 hour  Intake              240 ml  Output                0 ml  Net              240 ml    Exam Awake Alert, Oriented x 3, No new F.N deficits, Normal affect [place].AT,PERRAL Supple Neck,No JVD, No cervical lymphadenopathy appriciated.  Symmetrical Chest wall movement, Good air movement bilaterally, CTAB RRR,No Gallops,Rubs or new Murmurs, No Parasternal Heave +ve B.Sounds, Abd Soft, Non tender, No rebound -guarding or rigidity. No Cyanosis, Clubbing or edema, No new Rash or bruise  Data Review   CBC w Diff:  Lab Results  Component Value Date   WBC 5.8 06/16/2016   HGB 16.2 06/16/2016   HCT 46.9 06/16/2016   PLT 213 06/16/2016   LYMPHOPCT 7 06/15/2016   MONOPCT 14 06/15/2016   EOSPCT 0 06/15/2016   BASOPCT 0 06/15/2016    CMP:  Lab Results  Component Value Date   NA 135 06/16/2016   K 3.9 06/16/2016   CL 106 06/16/2016   CO2 23 06/16/2016   BUN 16 06/16/2016   CREATININE 0.86 06/16/2016   PROT 9.1 (H) 06/15/2016   ALBUMIN 5.3 (H) 06/15/2016   BILITOT 1.4 (H) 06/15/2016   ALKPHOS 63 06/15/2016   AST 21 06/15/2016   ALT 27 06/15/2016  .   Total Time in preparing paper work, data evaluation and todays exam - 35 minutes  ELGERGAWY, DAWOOD M.D on 06/18/2016 at 1:20 PM  [facility]   Office  [phone]

## 2016-06-18 NOTE — Progress Notes (Signed)
Patient discharged home.  IV removed - WNL.  Reviewed DC instructions and medications.  Patient able to voice diet restrictions to prevent further flare of Chron's disease.  GI to call for follow up. Patient verbalizes understanding. Assisted off unit by NT in NAD.

## 2016-06-18 NOTE — Care Management Note (Signed)
Case Management Note  Patient Details  Name: Collin Coleman MRN: [id number] Date of Birth: 1981-03-12  Subjective/Objective:                  Pt admitted with chrohns. He is from home, ind with ADL's. He has transportation and no difficulty affording or managing medications. He has PCP, cynthia butler, and but her practice has been put on hold. Pt given list of PCP accepting new pt's. Pt will r/u with GI.   Action/Plan: Pt discharging home today with self care. No further CM needs.   Expected Discharge Date:  06/19/16               Expected Discharge Plan:  Home/Self Care  In-House Referral:  NA  Discharge planning Services  CM Consult  Post Acute Care Choice:  NA Choice offered to:  NA  Status of Service:  Completed, signed off  Sherald Barge, RN 06/18/2016, 11:24 AM

## 2016-06-26 ENCOUNTER — Encounter: Payer: Self-pay | Admitting: Internal Medicine

## 2016-07-01 ENCOUNTER — Ambulatory Visit (INDEPENDENT_AMBULATORY_CARE_PROVIDER_SITE_OTHER): Payer: BLUE CROSS/BLUE SHIELD | Admitting: Internal Medicine

## 2016-07-01 ENCOUNTER — Telehealth (INDEPENDENT_AMBULATORY_CARE_PROVIDER_SITE_OTHER): Payer: Self-pay | Admitting: Internal Medicine

## 2016-07-01 ENCOUNTER — Telehealth (INDEPENDENT_AMBULATORY_CARE_PROVIDER_SITE_OTHER): Payer: Self-pay | Admitting: *Deleted

## 2016-07-01 NOTE — Telephone Encounter (Signed)
Patient was given an appointment to come in on Terri Setzer's schedule for today and have Dr. Laural Golden come over for the appointment as well.  A message was left on the patient's answering machine.  The patient called back after we had closed early due to inclement weather and stated that the appointment for 07/01/16 doesn't meet his schedule, unfortunately I know yall are only open one day a week and he has to work.  He stated that he also has an appointment scheduled with Dr. Santo Held Rourk's office.  He appreciates [place] getting him in, but the last he heard, we couldn't get him in until July, so he made other alternative physician's arrangements.  The message was forwarded to [place] for review.  The patient was told that we were watching for an opening to get him in as Dr. Laural Golden wanted to see him within two weeks, not that he couldn't be seen until July.

## 2016-07-01 NOTE — Telephone Encounter (Signed)
This gentlemen did not listen to our receptionist.   He was told that we would be calling to get him in the week of 3/12 per NUR request and we were watching his schedule to get him in on Tuesday or Thursday since he would be a work in that week.  As of 3/12 we didn't have a cancellation and we made decision to try to get him in on Terri's schedule with NUR walking over unless he ended up with cancellation due to weather.  He was told she would be calling him this week and he decided to go elsewhere which is his choice.    Facts need to be correct and we are not open one day a week--we are open 4 1/2 days a week and only NUR in one day and sometimes Thursday afternoon if schedule permits from hospital.

## 2016-07-07 ENCOUNTER — Ambulatory Visit (INDEPENDENT_AMBULATORY_CARE_PROVIDER_SITE_OTHER): Payer: BLUE CROSS/BLUE SHIELD | Admitting: Gastroenterology

## 2016-07-07 ENCOUNTER — Encounter: Payer: Self-pay | Admitting: Gastroenterology

## 2016-07-07 ENCOUNTER — Other Ambulatory Visit: Payer: Self-pay

## 2016-07-07 VITALS — BP 145/86 | HR 75 | Temp 97.6°F | Ht 66.0 in | Wt 183.8 lb

## 2016-07-07 DIAGNOSIS — R1011 Right upper quadrant pain: Secondary | ICD-10-CM

## 2016-07-07 DIAGNOSIS — K50119 Crohn's disease of large intestine with unspecified complications: Secondary | ICD-10-CM

## 2016-07-07 DIAGNOSIS — K5090 Crohn's disease, unspecified, without complications: Secondary | ICD-10-CM | POA: Diagnosis not present

## 2016-07-07 MED ORDER — PEG 3350-KCL-NA BICARB-NACL 420 G PO SOLR: 4000.0000 mL | ORAL | 0 refills | Status: DC

## 2016-07-07 MED ORDER — PREDNISONE 10 MG PO TABS: ORAL_TABLET | ORAL | 0 refills | Status: DC

## 2016-07-07 MED ORDER — PROMETHAZINE HCL 25 MG PO TABS: 25.0000 mg | ORAL_TABLET | Freq: Four times a day (QID) | ORAL | 3 refills | Status: DC | PRN

## 2016-07-07 MED ORDER — PANTOPRAZOLE SODIUM 40 MG PO TBEC: 40.0000 mg | DELAYED_RELEASE_TABLET | Freq: Every day | ORAL | 3 refills | Status: DC

## 2016-07-07 NOTE — Patient Instructions (Signed)
Please have blood work done today. We have scheduled you for an ultrasound of your gallbladder.   We have also scheduled you for a colonoscopy with Dr. Gala Romney in the near future.   We will see you in 3 months!

## 2016-07-07 NOTE — Progress Notes (Signed)
Primary Care Physician:  No PCP Per Patient Primary Gastroenterologist:  Dr. Gala Romney   Chief Complaint  Patient presents with  . Crohn's Disease  . Abdominal Pain    HPI:   Collin Coleman is a 36 y.o. male presenting today at the request of Forestine Na to establish care with a history of Crohn's disease, recently inpatient with a flare. CT 2/26 with wall thickening and mucosal enhancement and inflammatory stranding about the TI, most consistent with enteritis. Placed on IV steroids and then transitioned to oral Prednisone 40 mg daily with a gradual taper by 5 mg every week.   Originally diagnosed at age 57 by Dr. Oletta Lamas, then saw Dr. Gala Romney at age 65. We do not have any records.  No long-term therapy and states he was told to take Pentasa but felt he didn't want to at age 67. Watches what he eats and his stress and feels like he has managed well without taking anything. Went to the ED 2/25 due to acute on chronic diarrhea, N/V. Couldn't keep anything down.   Normal baseline is diarrhea perhaps 2-3 times per week. Normally with formed stool. No rectal bleeding. No therapy since 2001. Notes a constant RUQ dull pain that waxes and wanes in intensity since leaving the hospital. Phenergan helps. Placed on Protonix with prednisone. On Prednisone 30 now, has taken 40 for a week, then 35 for a week, now down to 30. No NSAIDs.     Past Medical History:  Diagnosis Date  . Crohn disease [facility])     Past Surgical History:  Procedure Laterality Date  . None      Current Outpatient Prescriptions  Medication Sig Dispense Refill  . pantoprazole (PROTONIX) 40 MG tablet Take 1 tablet (40 mg total) by mouth daily. 30 minutes prior to breakfast 90 tablet 3  . promethazine (PHENERGAN) 25 MG tablet Take 1 tablet (25 mg total) by mouth every 6 (six) hours as needed for nausea or vomiting. 60 tablet 3  . Vitamin D, Ergocalciferol, (DRISDOL) 50000 units CAPS capsule Take 50,000 Units by mouth 2 (two) times a  week.    . polyethylene glycol-electrolytes (TRILYTE) 420 g solution Take 4,000 mLs by mouth as directed. 4000 mL 0  . predniSONE (DELTASONE) 10 MG tablet Take 3 tablets for 6 days, then 2.5 tablets for 7 days, then 2 tablets for 7 days, then 1.5 tablets for 7 days, then 1 tablet for 7 days, then 1/2 tablet for 7 days and done. 75 tablet 0   No current facility-administered medications for this visit.     Allergies as of 07/07/2016 - Review Complete 07/07/2016  Allergen Reaction Noted  . Benadryl [diphenhydramine]  06/15/2016  . Pertussis vaccines  06/15/2016    Family History  Problem Relation Age of Onset  . Crohn's disease Neg Hx     no first degree relatives but states his great-great grandfather  . Colon cancer Neg Hx     Social History   Social History  . Marital status: Single    Spouse name: N/A  . Number of children: N/A  . Years of education: N/A   Occupational History  . Not on file.   Social History Main Topics  . Smoking status: Never Smoker  . Smokeless tobacco: Never Used  . Alcohol use No  . Drug use: No  . Sexual activity: Not on file   Other Topics Concern  . Not on file   Social History Narrative  .  No narrative on file    Review of Systems: As mentioned in HPI   Physical Exam: BP (!) 145/86   Pulse 75   Temp 97.6 F (36.4 C) (Oral)   Ht 5\' 6"  (1.676 m)   Wt 183 lb 12.8 oz (83.4 kg)   BMI 29.67 kg/m  General:   Alert and oriented. Pleasant and cooperative. Well-nourished and well-developed.  Head:  Normocephalic and atraumatic. Eyes:  Without icterus, sclera clear and conjunctiva pink.  Ears:  Normal auditory acuity. Nose:  No deformity, discharge,  or lesions. Lungs:  Clear to auscultation bilaterally. No wheezes, rales, or rhonchi. No distress.  Heart:  S1, S2 present without murmurs appreciated.  Abdomen:  +BS, soft, mild TTP RUQ and non-distended. No HSM noted. No guarding or rebound. No masses appreciated.  Rectal:  Deferred    Msk:  Symmetrical without gross deformities. Normal posture. Extremities:  Without  edema. Neurologic:  Alert and  oriented x4;  grossly normal neurologically. Psych:  Alert and cooperative. Normal mood and affect.  Lab Results  Component Value Date   WBC 5.8 06/16/2016   HGB 16.2 06/16/2016   HCT 46.9 06/16/2016   MCV 88.5 06/16/2016   PLT 213 06/16/2016   Lab Results  Component Value Date   ALT 27 06/15/2016   AST 21 06/15/2016   ALKPHOS 63 06/15/2016   BILITOT 1.4 (H) 06/15/2016

## 2016-07-07 NOTE — Care Management (Signed)
CM contacted by pt concerning his steroids, he will run out of Rx tomorrow and does not have appointment to see GI until 07/16/2016, office is not willing to refill rx until pt seen. [facility] office called by CM and they have open slot today at 130. Pt make aware and will make every effort to get to appointment today at 1:30pm.

## 2016-07-07 NOTE — Assessment & Plan Note (Signed)
36 year old male diagnosed at age 62 per his report, and he states he was seen at our practice at age 75. At that time, he reports being prescribed Pentasa; however, he did not want to take anything long-term and has been without any maintenance therapy since 2001. Recently inpatient for what was felt to be a Crohn's flare, with CT noting wall thickening and mucosal enhancement and inflammatory stranding about the TI. He was given IV steroids and transitioned to prednisone. He is on a slow taper and desires to taper down by 5 mg a week. Currently, he has gone from 40 mg several weeks ago to now 30. He is quite hesitant to pursue any long-term medication, but I discussed with him the importance of a colonoscopy for diagnostic purposes and evaluation of disease endoscopically. Clinically, he is back to his baseline of diarrhea 2-3 times a week, other normal BMs. No rectal bleeding or anemia noted. Will continue prednisone taper and pursue colonoscopy in about 4 weeks.   Proceed with TCS with Dr. Gala Romney in near future: the risks, benefits, and alternatives have been discussed with the patient in detail. The patient states understanding and desires to proceed. Phenergan 25 mg IV on call Prednisone taper prescribed

## 2016-07-07 NOTE — Assessment & Plan Note (Addendum)
New onset since leaving the hospital. Pursue gallbladder ultrasound and recheck HFP. Further recommendations to follow. Continue Protonix in setting of steroid therapy.   ADDENDUM 4/18: US abdomen with gallbladder polyp. Will need serial ultrasounds. HFP normal. RUQ pain is intermittent, sometimes associated with nausea, worsening by NOT eating. Sometimes movement exacerbates. Likely musculoskeletal component. Discussed adding EGD at time of colonoscopy, which patient is agreeable to. Would then pursue HIDA if EGD negative. Patient aware regarding risks and benefits.  Proceed with upper endoscopy in the near future with Dr. Gala Romney. The risks, benefits, and alternatives have been discussed in detail with patient. They have stated understanding and desire to proceed.  Phenergan 25 mg IV on call

## 2016-07-08 LAB — HEPATIC FUNCTION PANEL
ALBUMIN: 4 g/dL (ref 3.6–5.1)
ALT: 22 U/L (ref 9–46)
AST: 13 U/L (ref 10–40)
Alkaline Phosphatase: 34 U/L — ABNORMAL LOW (ref 40–115)
BILIRUBIN DIRECT: 0.1 mg/dL (ref <=0.2)
Indirect Bilirubin: 0.5 mg/dL (ref 0.2–1.2)
TOTAL PROTEIN: 6 g/dL — AB (ref 6.1–8.1)
Total Bilirubin: 0.6 mg/dL (ref 0.2–1.2)

## 2016-07-08 NOTE — Progress Notes (Signed)
No pcp per patient 

## 2016-07-11 ENCOUNTER — Ambulatory Visit (HOSPITAL_COMMUNITY)
Admission: RE | Admit: 2016-07-11 | Discharge: 2016-07-11 | Disposition: A | Discharge status: Home / Self Care | Payer: BLUE CROSS/BLUE SHIELD | Source: Ambulatory Visit | Attending: Gastroenterology | Admitting: Gastroenterology

## 2016-07-11 DIAGNOSIS — K824 Cholesterolosis of gallbladder: Secondary | ICD-10-CM | POA: Insufficient documentation

## 2016-07-11 DIAGNOSIS — K760 Fatty (change of) liver, not elsewhere classified: Secondary | ICD-10-CM | POA: Insufficient documentation

## 2016-07-11 DIAGNOSIS — R1011 Right upper quadrant pain: Secondary | ICD-10-CM | POA: Diagnosis present

## 2016-07-14 NOTE — Progress Notes (Signed)
LFTs are normal. [place] with fatty liver and gallbladder polyp. This will need to be followed every 6 months. If persistent RUQ pain, needs HIDA.

## 2016-07-15 ENCOUNTER — Encounter: Payer: Self-pay | Admitting: Family Medicine

## 2016-07-15 ENCOUNTER — Ambulatory Visit (INDEPENDENT_AMBULATORY_CARE_PROVIDER_SITE_OTHER): Payer: BLUE CROSS/BLUE SHIELD | Admitting: Family Medicine

## 2016-07-15 VITALS — BP 138/100 | HR 88 | Temp 98.9°F | Resp 18 | Ht 66.0 in | Wt 187.0 lb

## 2016-07-15 DIAGNOSIS — K7581 Nonalcoholic steatohepatitis (NASH): Secondary | ICD-10-CM | POA: Diagnosis not present

## 2016-07-15 DIAGNOSIS — Z8241 Family history of sudden cardiac death: Secondary | ICD-10-CM | POA: Insufficient documentation

## 2016-07-15 DIAGNOSIS — E559 Vitamin D deficiency, unspecified: Secondary | ICD-10-CM | POA: Diagnosis not present

## 2016-07-15 DIAGNOSIS — R197 Diarrhea, unspecified: Secondary | ICD-10-CM

## 2016-07-15 DIAGNOSIS — K5090 Crohn's disease, unspecified, without complications: Secondary | ICD-10-CM

## 2016-07-15 LAB — CBC WITH DIFFERENTIAL/PLATELET
BASOS ABS: 0 {cells}/uL (ref 0–200)
Basophils Relative: 0 %
EOS ABS: 160 {cells}/uL (ref 15–500)
Eosinophils Relative: 2 %
HEMATOCRIT: 50.4 % — AB (ref 38.5–50.0)
HEMOGLOBIN: 17.1 g/dL (ref 13.2–17.1)
LYMPHS ABS: 1360 {cells}/uL (ref 850–3900)
Lymphocytes Relative: 17 %
MCH: 30.8 pg (ref 27.0–33.0)
MCHC: 33.9 g/dL (ref 32.0–36.0)
MCV: 90.6 fL (ref 80.0–100.0)
MONO ABS: 960 {cells}/uL — AB (ref 200–950)
MPV: 8.6 fL (ref 7.5–12.5)
Monocytes Relative: 12 %
NEUTROS PCT: 69 %
Neutro Abs: 5520 cells/uL (ref 1500–7800)
Platelets: 165 10*3/uL (ref 140–400)
RBC: 5.56 MIL/uL (ref 4.20–5.80)
RDW: 14.8 % (ref 11.0–15.0)
WBC: 8 10*3/uL (ref 3.8–10.8)

## 2016-07-15 NOTE — Patient Instructions (Signed)
Continue to drink lots of water Follow your diet and medications Need labs today I will send you a letter with your test results.  If there is anything of concern, we will call right away.  See me in 3-4 weeks Need to re check your blood pressure

## 2016-07-15 NOTE — Progress Notes (Signed)
Chief Complaint  Patient presents with  . Establish Care   New to establish Patient is here with his mother He is a pleasant 36 year old gentleman with a history of Crohn's disease. He hasn't been active since his childhood, but flared up recently. He was hospitalized with vomiting diarrhea and dehydration. He responded to fluids and steroids. He has seen GI in follow-up. He is scheduled for colonoscopy. He is currently on a prednisone taper. He uses Protonix daily. He takes Phenergan as needed. His only other medication is vitamin D, needed for a history of vitamin D deficiency. His only concern is right upper quadrant pain. This is been present ever since his hospitalization. It waxes and wanes but never goes away. It is never severe. It is not related to food. It is not related to bowels. No fever or chills. When the pain is severe he'll have nausea. He had an ultrasound of his abdomen which showed a fatty liver and gallbladder polyp, otherwise negative. He states the pain is worse when his stomach is empty, and feels better when he's eaten. He does not have any heartburn or acid reflux symptoms.    Patient Active Problem List   Diagnosis Date Noted  . NASH (nonalcoholic steatohepatitis) 08-08-2016  . Vitamin D deficiency 08-08-16  . Family history of death due to heart problem at 52 years of age or younger 2016/08/08  . RUQ pain 07/07/2016  . Crohn's disease without complication ([place])   . Diarrhea     Outpatient Encounter Prescriptions as of 08-08-16  Medication Sig  . pantoprazole (PROTONIX) 40 MG tablet Take 1 tablet (40 mg total) by mouth daily. 30 minutes prior to breakfast  . predniSONE (DELTASONE) 10 MG tablet Take 3 tablets for 6 days, then 2.5 tablets for 7 days, then 2 tablets for 7 days, then 1.5 tablets for 7 days, then 1 tablet for 7 days, then 1/2 tablet for 7 days and done.  . promethazine (PHENERGAN) 25 MG tablet Take 1 tablet (25 mg total) by mouth every 6 (six)  hours as needed for nausea or vomiting.  . Vitamin D, Ergocalciferol, (DRISDOL) 50000 units CAPS capsule Take 50,000 Units by mouth 2 (two) times a week.  . [DISCONTINUED] polyethylene glycol-electrolytes (TRILYTE) 420 g solution Take 4,000 mLs by mouth as directed. (Patient not taking: Reported on 2016/08/08)   No facility-administered encounter medications on file as of 08-08-2016.     Past Medical History:  Diagnosis Date  . Allergy   . Crohn disease ([place])   . Hypertension     Past Surgical History:  Procedure Laterality Date  . None      Social History   Social History  . Marital status: Single    Spouse name: N/A  . Number of children: 0  . Years of education: 14   Occupational History  . Science writer General   Social History Main Topics  . Smoking status: Never Smoker  . Smokeless tobacco: Never Used  . Alcohol use No  . Drug use: No  . Sexual activity: Not Currently   Other Topics Concern  . Not on file   Social History Narrative   Lives with Collin Coleman and her husband   Enjoy Geneticist, [employer]    Family History  Problem Relation Age of Onset  . Arthritis Mother   . Asthma Mother   . Depression Mother   . Heart disease Mother 69    CABG  . Hyperlipidemia  Mother   . Hypertension Mother   . Stroke Mother   . Diabetes Father   . Heart disease Father 12    Heart attack  . Heart disease Maternal Grandmother   . Heart disease Paternal Grandfather 32  . Crohn's disease Neg Hx     no first degree relatives but states his great-great grandfather  . Colon cancer Neg Hx     Review of Systems  Constitutional: Negative for chills, fever and weight loss.  HENT: Negative for congestion and hearing loss.   Eyes: Negative for blurred vision and pain.  Respiratory: Negative for cough and shortness of breath.   Cardiovascular: Negative for chest pain and leg swelling.  Gastrointestinal: Positive for abdominal pain. Negative for  constipation, diarrhea and heartburn.  Genitourinary: Negative for dysuria and frequency.  Musculoskeletal: Negative for falls, joint pain and myalgias.  Neurological: Negative for dizziness, seizures and headaches.  Psychiatric/Behavioral: Negative for depression. The patient is not nervous/anxious and does not have insomnia.     BP (!) 138/100 (BP Location: Right Arm, Patient Position: Sitting, Cuff Size: Normal)   Pulse 88   Temp 98.9 F (37.2 C) (Temporal)   Resp 18   Ht 5\' 6"  (1.676 m)   Wt 187 lb 0.6 oz (84.8 kg)   SpO2 99%   BMI 30.19 kg/m   Physical Exam  Constitutional: He is oriented to person, place, and time. He appears well-developed and well-nourished.  HENT:  Head: Normocephalic and atraumatic.  Right Ear: External ear normal.  Left Ear: External ear normal.  Mouth/Throat: Oropharynx is clear and moist.  Eyes: Conjunctivae are normal. Pupils are equal, round, and reactive to light.  Neck: Normal range of motion. Neck supple. No thyromegaly present.  Cardiovascular: Normal rate, regular rhythm and normal heart sounds.   Pulmonary/Chest: Effort normal and breath sounds normal. No respiratory distress.  Abdominal: Soft. Bowel sounds are normal. There is tenderness.  Minimal tenderness right upper quadrant. No guarding or rebound. No palpable mass. No organomegaly.  Musculoskeletal: Normal range of motion. He exhibits no edema.  Lymphadenopathy:    He has no cervical adenopathy.  Neurological: He is alert and oriented to person, place, and time. He displays normal reflexes.  Gait normal  Skin: Skin is warm and dry.  Psychiatric: He has a normal mood and affect. His behavior is normal. Thought content normal.  Nursing note and vitals reviewed.  ASSESSMENT/PLAN:  1. NASH (nonalcoholic steatohepatitis)   2. Crohn's disease without complication, unspecified gastrointestinal tract location ([place]) - CBC with Differential/Platelet - COMPLETE METABOLIC PANEL WITH  GFR - HIV antibody - Lipid panel - Urinalysis, Routine w reflex microscopic - VITAMIN D 25 Hydroxy (Vit-D Deficiency, Fractures) - Vitamin B12  3. Vitamin D deficiency  4. Family history of death due to heart problem at 33 years of age or younger  59. Diarrhea, unspecified type    Patient Instructions  Continue to drink lots of water Follow your diet and medications Need labs today I will send you a letter with your test results.  If there is anything of concern, we will call right away.  See me in 3-4 weeks Need to re check your blood pressure   Raylene Everts, MD

## 2016-07-16 ENCOUNTER — Ambulatory Visit: Payer: BLUE CROSS/BLUE SHIELD | Admitting: Gastroenterology

## 2016-07-16 LAB — LIPID PANEL
CHOLESTEROL: 168 mg/dL (ref <200)
HDL: 63 mg/dL (ref >40)
LDL CALC: 87 mg/dL (ref <100)
Total CHOL/HDL Ratio: 2.7 Ratio (ref <5.0)
Triglycerides: 90 mg/dL (ref <150)
VLDL: 18 mg/dL (ref <30)

## 2016-07-16 LAB — COMPLETE METABOLIC PANEL WITH GFR
ALBUMIN: 3.9 g/dL (ref 3.6–5.1)
ALT: 23 U/L (ref 9–46)
AST: 14 U/L (ref 10–40)
Alkaline Phosphatase: 35 U/L — ABNORMAL LOW (ref 40–115)
BILIRUBIN TOTAL: 1.1 mg/dL (ref 0.2–1.2)
BUN: 16 mg/dL (ref 7–25)
CALCIUM: 9.1 mg/dL (ref 8.6–10.3)
CO2: 28 mmol/L (ref 20–31)
CREATININE: 0.87 mg/dL (ref 0.60–1.35)
Chloride: 99 mmol/L (ref 98–110)
GFR, Est African American: >89 mL/min (ref >=60)
Glucose, Bld: 75 mg/dL (ref 65–99)
POTASSIUM: 4 mmol/L (ref 3.5–5.3)
Sodium: 139 mmol/L (ref 135–146)
TOTAL PROTEIN: 6.2 g/dL (ref 6.1–8.1)

## 2016-07-16 LAB — URINALYSIS, ROUTINE W REFLEX MICROSCOPIC
BILIRUBIN URINE: NEGATIVE
Glucose, UA: NEGATIVE
Hgb urine dipstick: NEGATIVE
Ketones, ur: NEGATIVE
LEUKOCYTES UA: NEGATIVE
Nitrite: NEGATIVE
PROTEIN: NEGATIVE
SPECIFIC GRAVITY, URINE: 1.022 (ref 1.001–1.035)
pH: 7 (ref 5.0–8.0)

## 2016-07-16 LAB — VITAMIN B12: Vitamin B-12: 345 pg/mL (ref 200–1100)

## 2016-07-16 LAB — VITAMIN D 25 HYDROXY (VIT D DEFICIENCY, FRACTURES): VIT D 25 HYDROXY: 37 ng/mL (ref 30–100)

## 2016-07-16 LAB — HIV ANTIBODY (ROUTINE TESTING W REFLEX): HIV 1&2 Ab, 4th Generation: NONREACTIVE

## 2016-07-16 NOTE — Progress Notes (Signed)
ON RECALL  °

## 2016-08-05 ENCOUNTER — Encounter: Payer: Self-pay | Admitting: Gastroenterology

## 2016-08-06 ENCOUNTER — Other Ambulatory Visit: Payer: Self-pay

## 2016-08-06 ENCOUNTER — Telehealth: Payer: Self-pay | Admitting: Gastroenterology

## 2016-08-06 DIAGNOSIS — R109 Unspecified abdominal pain: Secondary | ICD-10-CM

## 2016-08-06 DIAGNOSIS — K50919 Crohn's disease, unspecified, with unspecified complications: Secondary | ICD-10-CM

## 2016-08-06 NOTE — Telephone Encounter (Signed)
Collin Coleman, is this an issue or do we need to just plug him into another day? I was not planning on an EGD at appt time, but thus far his work-up is benign for any biliary etiology. He is going to have a colonoscopy, and with persistent abdominal pain, I recommended considering an EGD. He has been somewhat delayed -to get back with [place] on our recommendations, so this is why it's last minute. I don't want to cause schedule upheaval just to add an EGD.

## 2016-08-06 NOTE — Telephone Encounter (Signed)
Is it too late to add an EGD on to colonoscopy?

## 2016-08-06 NOTE — Telephone Encounter (Signed)
Re-entered procedure orders for 08/14/16 at 10:30am. EGD added with TCS. Tried to call pt to inform him EGD was added. LMOVM and informed him and that instructions for procedure will stay the same.   Endo scheduler is aware.

## 2016-08-06 NOTE — Telephone Encounter (Signed)
Collin Coleman would have to talk to someone over Endo first since it would add 15 minutes to his case, therefore the patients below him would be bumped down 15 minutes and it would put the end of day going past 3:00pm.   His orders would need to be re-entered to add EGD.

## 2016-08-06 NOTE — Telephone Encounter (Signed)
Collin Coleman, please add the procedure on and I will speak with [name] if I need to.

## 2016-08-07 NOTE — Telephone Encounter (Signed)
Patient is aware 

## 2016-08-13 ENCOUNTER — Telehealth: Payer: Self-pay

## 2016-08-13 NOTE — Telephone Encounter (Signed)
Tried to call with no answer  

## 2016-08-14 ENCOUNTER — Ambulatory Visit (INDEPENDENT_AMBULATORY_CARE_PROVIDER_SITE_OTHER): Payer: BLUE CROSS/BLUE SHIELD | Admitting: Family Medicine

## 2016-08-14 ENCOUNTER — Ambulatory Visit (HOSPITAL_COMMUNITY)
Admission: RE | Admit: 2016-08-14 | Discharge: 2016-08-14 | Disposition: A | Discharge status: Home / Self Care | Payer: BLUE CROSS/BLUE SHIELD | Source: Ambulatory Visit | Attending: Internal Medicine | Admitting: Internal Medicine

## 2016-08-14 ENCOUNTER — Encounter (HOSPITAL_COMMUNITY): Payer: Self-pay

## 2016-08-14 ENCOUNTER — Encounter: Payer: Self-pay | Admitting: Family Medicine

## 2016-08-14 ENCOUNTER — Ambulatory Visit (HOSPITAL_COMMUNITY): Admit: 2016-08-14 | Payer: BLUE CROSS/BLUE SHIELD | Admitting: Internal Medicine

## 2016-08-14 ENCOUNTER — Encounter (HOSPITAL_COMMUNITY): Payer: Self-pay | Admitting: *Deleted

## 2016-08-14 ENCOUNTER — Encounter (HOSPITAL_COMMUNITY)
Admission: RE | Disposition: A | Discharge status: Home / Self Care | Payer: Self-pay | Source: Ambulatory Visit | Attending: Internal Medicine

## 2016-08-14 DIAGNOSIS — Z823 Family history of stroke: Secondary | ICD-10-CM | POA: Insufficient documentation

## 2016-08-14 DIAGNOSIS — I10 Essential (primary) hypertension: Secondary | ICD-10-CM | POA: Insufficient documentation

## 2016-08-14 DIAGNOSIS — K209 Esophagitis, unspecified: Secondary | ICD-10-CM | POA: Insufficient documentation

## 2016-08-14 DIAGNOSIS — Z8349 Family history of other endocrine, nutritional and metabolic diseases: Secondary | ICD-10-CM | POA: Insufficient documentation

## 2016-08-14 DIAGNOSIS — Z8261 Family history of arthritis: Secondary | ICD-10-CM | POA: Insufficient documentation

## 2016-08-14 DIAGNOSIS — D120 Benign neoplasm of cecum: Secondary | ICD-10-CM | POA: Diagnosis not present

## 2016-08-14 DIAGNOSIS — K7581 Nonalcoholic steatohepatitis (NASH): Secondary | ICD-10-CM | POA: Diagnosis not present

## 2016-08-14 DIAGNOSIS — K635 Polyp of colon: Secondary | ICD-10-CM | POA: Diagnosis not present

## 2016-08-14 DIAGNOSIS — Z818 Family history of other mental and behavioral disorders: Secondary | ICD-10-CM | POA: Diagnosis not present

## 2016-08-14 DIAGNOSIS — K5090 Crohn's disease, unspecified, without complications: Secondary | ICD-10-CM | POA: Insufficient documentation

## 2016-08-14 DIAGNOSIS — I8500 Esophageal varices without bleeding: Secondary | ICD-10-CM | POA: Diagnosis not present

## 2016-08-14 DIAGNOSIS — R109 Unspecified abdominal pain: Secondary | ICD-10-CM

## 2016-08-14 DIAGNOSIS — Z887 Allergy status to serum and vaccine status: Secondary | ICD-10-CM | POA: Insufficient documentation

## 2016-08-14 DIAGNOSIS — K529 Noninfective gastroenteritis and colitis, unspecified: Secondary | ICD-10-CM

## 2016-08-14 DIAGNOSIS — K449 Diaphragmatic hernia without obstruction or gangrene: Secondary | ICD-10-CM

## 2016-08-14 DIAGNOSIS — Z8249 Family history of ischemic heart disease and other diseases of the circulatory system: Secondary | ICD-10-CM | POA: Diagnosis not present

## 2016-08-14 DIAGNOSIS — Z833 Family history of diabetes mellitus: Secondary | ICD-10-CM | POA: Insufficient documentation

## 2016-08-14 DIAGNOSIS — Z888 Allergy status to other drugs, medicaments and biological substances status: Secondary | ICD-10-CM | POA: Insufficient documentation

## 2016-08-14 DIAGNOSIS — Z825 Family history of asthma and other chronic lower respiratory diseases: Secondary | ICD-10-CM | POA: Insufficient documentation

## 2016-08-14 DIAGNOSIS — R1013 Epigastric pain: Secondary | ICD-10-CM | POA: Insufficient documentation

## 2016-08-14 DIAGNOSIS — K50919 Crohn's disease, unspecified, with unspecified complications: Secondary | ICD-10-CM

## 2016-08-14 HISTORY — PX: ESOPHAGOGASTRODUODENOSCOPY: SHX5428

## 2016-08-14 HISTORY — PX: BIOPSY: SHX5522

## 2016-08-14 HISTORY — PX: POLYPECTOMY: SHX5525

## 2016-08-14 HISTORY — PX: COLONOSCOPY: SHX5424

## 2016-08-14 HISTORY — DX: Diaphragmatic hernia without obstruction or gangrene: K44.9

## 2016-08-14 SURGERY — COLONOSCOPY
Anesthesia: Moderate Sedation

## 2016-08-14 MED ORDER — PROMETHAZINE HCL 25 MG/ML IJ SOLN
25.0000 mg | Freq: Once | INTRAMUSCULAR | Status: AC
  Administered 2016-08-14: 25 mg via INTRAVENOUS

## 2016-08-14 MED ORDER — LIDOCAINE VISCOUS 2 % MT SOLN
OROMUCOSAL | Status: DC | PRN
  Administered 2016-08-14: 3 mL via OROMUCOSAL

## 2016-08-14 MED ORDER — MEPERIDINE HCL 100 MG/ML IJ SOLN
INTRAMUSCULAR | Status: AC
  Filled 2016-08-14: qty 2

## 2016-08-14 MED ORDER — LIDOCAINE VISCOUS 2 % MT SOLN
OROMUCOSAL | Status: AC
  Filled 2016-08-14: qty 15

## 2016-08-14 MED ORDER — ONDANSETRON HCL 4 MG/2ML IJ SOLN
INTRAMUSCULAR | Status: AC
  Filled 2016-08-14: qty 2

## 2016-08-14 MED ORDER — SODIUM CHLORIDE 0.9% FLUSH
INTRAVENOUS | Status: DC
Start: 2016-08-14 — End: 2016-08-14
  Filled 2016-08-14: qty 10

## 2016-08-14 MED ORDER — MEPERIDINE HCL 100 MG/ML IJ SOLN
INTRAMUSCULAR | Status: DC | PRN
  Administered 2016-08-14 (×2): 50 mg via INTRAVENOUS

## 2016-08-14 MED ORDER — MIDAZOLAM HCL 5 MG/5ML IJ SOLN
INTRAMUSCULAR | Status: AC
  Filled 2016-08-14: qty 10

## 2016-08-14 MED ORDER — PROMETHAZINE HCL 25 MG/ML IJ SOLN
INTRAMUSCULAR | Status: AC
  Filled 2016-08-14: qty 1

## 2016-08-14 MED ORDER — SODIUM CHLORIDE 0.9 % IV SOLN
INTRAVENOUS | Status: DC
  Administered 2016-08-14: 10:00:00 via INTRAVENOUS

## 2016-08-14 MED ORDER — MIDAZOLAM HCL 5 MG/5ML IJ SOLN
INTRAMUSCULAR | Status: DC | PRN
  Administered 2016-08-14 (×2): 2 mg via INTRAVENOUS

## 2016-08-14 MED ORDER — STERILE WATER FOR IRRIGATION IR SOLN
Status: DC | PRN
  Administered 2016-08-14: 10:00:00

## 2016-08-14 MED ORDER — ONDANSETRON HCL 4 MG/2ML IJ SOLN
INTRAMUSCULAR | Status: DC | PRN
  Administered 2016-08-14: 4 mg via INTRAVENOUS

## 2016-08-14 NOTE — Progress Notes (Signed)
Family member with patient. Patient requests to" sit a little longer."

## 2016-08-14 NOTE — Discharge Instructions (Signed)
Colonoscopy Discharge Instructions  Read the instructions outlined below and refer to this sheet in the next few weeks. These discharge instructions provide you with general information on caring for yourself after you leave the hospital. Your doctor may also give you specific instructions. While your treatment has been planned according to the most current medical practices available, unavoidable complications occasionally occur. If you have any problems or questions after discharge, call Dr. Gala Romney at [phone]. ACTIVITY  You may resume your regular activity, but move at a slower pace for the next 24 hours.   Take frequent rest periods for the next 24 hours.   Walking will help get rid of the air and reduce the bloated feeling in your belly (abdomen).   No driving for 24 hours (because of the medicine (anesthesia) used during the test).    Do not sign any important legal documents or operate any machinery for 24 hours (because of the anesthesia used during the test).  NUTRITION  Drink plenty of fluids.   You may resume your normal diet as instructed by your doctor.   Begin with a light meal and progress to your normal diet. Heavy or fried foods are harder to digest and may make you feel sick to your stomach (nauseated).   Avoid alcoholic beverages for 24 hours or as instructed.  MEDICATIONS  You may resume your normal medications unless your doctor tells you otherwise.  WHAT YOU CAN EXPECT TODAY  Some feelings of bloating in the abdomen.   Passage of more gas than usual.   Spotting of blood in your stool or on the toilet paper.  IF YOU HAD POLYPS REMOVED DURING THE COLONOSCOPY:  No aspirin products for 7 days or as instructed.   No alcohol for 7 days or as instructed.   Eat a soft diet for the next 24 hours.  FINDING OUT THE RESULTS OF YOUR TEST Not all test results are available during your visit. If your test results are not back during the visit, make an appointment  with your caregiver to find out the results. Do not assume everything is normal if you have not heard from your caregiver or the medical facility. It is important for you to follow up on all of your test results.  SEEK IMMEDIATE MEDICAL ATTENTION IF:  You have more than a spotting of blood in your stool.   Your belly is swollen (abdominal distention).   You are nauseated or vomiting.   You have a temperature over 101.   You have abdominal pain or discomfort that is severe or gets worse throughout the day.   EGD Discharge instructions Please read the instructions outlined below and refer to this sheet in the next few weeks. These discharge instructions provide you with general information on caring for yourself after you leave the hospital. Your doctor may also give you specific instructions. While your treatment has been planned according to the most current medical practices available, unavoidable complications occasionally occur. If you have any problems or questions after discharge, please call your doctor. ACTIVITY  You may resume your regular activity but move at a slower pace for the next 24 hours.   Take frequent rest periods for the next 24 hours.   Walking will help expel (get rid of) the air and reduce the bloated feeling in your abdomen.   No driving for 24 hours (because of the anesthesia (medicine) used during the test).   You may shower.   Do not sign any  important legal documents or operate any machinery for 24 hours (because of the anesthesia used during the test).  NUTRITION  Drink plenty of fluids.   You may resume your normal diet.   Begin with a light meal and progress to your normal diet.   Avoid alcoholic beverages for 24 hours or as instructed by your caregiver.  MEDICATIONS  You may resume your normal medications unless your caregiver tells you otherwise.  WHAT YOU CAN EXPECT TODAY  You may experience abdominal discomfort such as a feeling of  fullness or gas pains.  FOLLOW-UP  Your doctor will discuss the results of your test with you.  SEEK IMMEDIATE MEDICAL ATTENTION IF ANY OF THE FOLLOWING OCCUR:  Excessive nausea (feeling sick to your stomach) and/or vomiting.   Severe abdominal pain and distention (swelling).   Trouble swallowing.   Temperature over 101 F (37.8 C).   Rectal bleeding or vomiting of blood.    Protonix 40 mg twice daily  GERD information provided  Informational Crohn's disease disease provided  CTE of the abdomen and pelvis to further evaluate the abnormal terminal ileum seen today  Further recommendations to follow pending review of pathology report  Office visit with [place] in 6 weeks

## 2016-08-14 NOTE — Op Note (Signed)
[facility] Patient Name: Collin Coleman Procedure Date: 08/14/2016 10:23 AM MRN: [id number] Date of Birth: September 30, 1980 Attending MD: Norvel Richards , MD CSN: [id number] Age: 36 Admit Type: Outpatient Procedure:                Upper GI endoscopy Indications:              Dyspepsia Providers:                Norvel Richards, MD, Jeanann Lewandowsky. Gwenlyn Perking RN, RN,                            Randa Spike, Technician Referring MD:              Medicines:                Midazolam 2 mg IV, Meperidine 50 mg IV, Ondansetron                            4 mg IV Complications:            No immediate complications. Estimated Blood Loss:     Estimated blood loss: none. Procedure:                Pre-Anesthesia Assessment:                           - Prior to the procedure, a History and Physical                            was performed, and patient medications and                            allergies were reviewed. The patient's tolerance of                            previous anesthesia was also reviewed. The risks                            and benefits of the procedure and the sedation                            options and risks were discussed with the patient.                            All questions were answered, and informed consent                            was obtained. Prior Anticoagulants: The patient has                            taken no previous anticoagulant or antiplatelet                            agents. ASA Grade Assessment: II - A patient with  mild systemic disease. After reviewing the risks                            and benefits, the patient was deemed in                            satisfactory condition to undergo the procedure.                           After obtaining informed consent, the endoscope was                            passed under direct vision. Throughout the                            procedure, the patient's blood pressure,  pulse, and                            oxygen saturations were monitored continuously. The                            EG-[id number] ([id number]) scope was introduced through the                            mouth, and advanced to the second part of duodenum.                            The upper GI endoscopy was accomplished without                            difficulty. The patient tolerated the procedure                            well. The patient tolerated the procedure well. The                            upper GI endoscopy was accomplished without                            difficulty. Scope In: 10:36:30 AM Scope Out: 10:41:02 AM Total Procedure Duration: 0 hours 4 minutes 32 seconds  Findings:      LA Grade B (one or more mucosal breaks greater than 5 mm, not extending       between the tops of two mucosal folds) esophagitis was found 35 to 37 cm       from the incisors. 3 columns grade 1 esophageal varices. No Barrett's       epithelium seen.      A small hiatal hernia was present.      The duodenal bulb and second portion of the duodenum were normal. Impression:               - LA Grade B esophagitis.                           - Small hiatal  hernia. Grade 1 esophageal varices.                           - Normal duodenal bulb and second portion of the                            duodenum.                           - No specimens collected. Moderate Sedation:      Moderate (conscious) sedation was administered by the endoscopy nurse       and supervised by the endoscopist. The following parameters were       monitored: oxygen saturation, heart rate, blood pressure, respiratory       rate, EKG, adequacy of pulmonary ventilation, and response to care.       Total physician intraservice time was 37 minutes. Recommendation:           - Patient has a contact number available for                            emergencies. The signs and symptoms of potential                            delayed  complications were discussed with the                            patient. Return to normal activities tomorrow.                            Written discharge instructions were provided to the                            patient.                           - Resume previous diet.                           - Continue present medications. Increase Protonix                            to 40 mg twice daily.                           - No repeat upper endoscopy.                           - Return to GI office in 6 weeks. See colonoscopy                            report. Procedure Code(s):        --- Professional ---                           [phone], Esophagogastroduodenoscopy, flexible,  transoral; diagnostic, including collection of                            specimen(s) by brushing or washing, when performed                            (separate procedure)                           99152, Moderate sedation services provided by the                            same physician or other qualified health care                            professional performing the diagnostic or                            therapeutic service that the sedation supports,                            requiring the presence of an independent trained                            observer to assist in the monitoring of the                            patient's level of consciousness and physiological                            status; initial 15 minutes of intraservice time,                            patient age 54 years or older                           [phone], Moderate sedation services; each additional                            15 minutes intraservice time Diagnosis Code(s):        --- Professional ---                           K20.9, Esophagitis, unspecified                           K44.9, Diaphragmatic hernia without obstruction or                            gangrene                            R10.13, Epigastric pain CPT copyright 2016 American Medical Association. All rights reserved. The codes documented in this report are preliminary and upon coder review may  be revised to meet current compliance requirements. Collin Estimable. Rourk, MD Norvel Richards,  MD 08/14/2016 11:10:31 AM This report has been signed electronically. Number of Addenda: 0

## 2016-08-14 NOTE — Patient Instructions (Addendum)
Hiatal Hernia A hiatal hernia occurs when part of the stomach slides above the muscle that separates the abdomen from the chest (diaphragm). A person can be born with a hiatal hernia (congenital), or it may develop over time. In almost all cases of hiatal hernia, only the top part of the stomach pushes through the diaphragm. Many people have a hiatal hernia with no symptoms. The larger the hernia, the more likely it is that you will have symptoms. In some cases, a hiatal hernia allows stomach acid to flow back into the tube that carries food from your mouth to your stomach (esophagus). This may cause heartburn symptoms. Severe heartburn symptoms may mean that you have developed a condition called gastroesophageal reflux disease (GERD). What are the causes? This condition is caused by a weakness in the opening (hiatus) where the esophagus passes through the diaphragm to attach to the upper part of the stomach. A person may be born with a weakness in the hiatus, or a weakness can develop over time. What increases the risk? This condition is more likely to develop in:  Older people. Age is a major risk factor for a hiatal hernia, especially if you are over the age of 28.  Pregnant women.  People who are overweight.  People who have frequent constipation. What are the signs or symptoms? Symptoms of this condition usually develop in the form of GERD symptoms. Symptoms include:  Heartburn.  Belching.  Indigestion.  Trouble swallowing.  Coughing or wheezing.  Sore throat.  Hoarseness.  Chest pain.  Nausea and vomiting. How is this diagnosed? This condition may be diagnosed during testing for GERD. Tests that may be done include:  X-rays of your stomach or chest.  An upper gastrointestinal (GI) series. This is an X-ray exam of your GI tract that is taken after you swallow a chalky liquid that shows up clearly on the X-ray.  Endoscopy. This is a procedure to look into your stomach  using a thin, flexible tube that has a tiny camera and light on the end of it. How is this treated? This condition may be treated by:  Dietary and lifestyle changes to help reduce GERD symptoms.  Medicines. These may include:  Over-the-counter antacids.  Medicines that make your stomach empty more quickly.  Medicines that block the production of stomach acid (H2 blockers).  Stronger medicines to reduce stomach acid (proton pump inhibitors).  Surgery to repair the hernia, if other treatments are not helping. If you have no symptoms, you may not need treatment. Follow these instructions at home: Lifestyle and activity   Do not use any products that contain nicotine or tobacco, such as cigarettes and e-cigarettes. If you need help quitting, ask your health care provider.  Try to achieve and maintain a healthy body weight.  Avoid putting pressure on your abdomen. Anything that puts pressure on your abdomen increases the amount of acid that may be pushed up into your esophagus.  Avoid bending over, especially after eating.  Raise the head of your bed by putting blocks under the legs. This keeps your head and esophagus higher than your stomach.  Do not wear tight clothing around your chest or stomach.  Try not to strain when having a bowel movement, when urinating, or when lifting heavy objects. Eating and drinking   Avoid foods that can worsen GERD symptoms. These may include:  Fatty foods, like fried foods.  Citrus fruits, like oranges or lemon.  Other foods and drinks that contain acid,  like orange juice or tomatoes.  Spicy food.  Chocolate.  Eat frequent small meals instead of three large meals a day. This helps prevent your stomach from getting too full.  Eat slowly.  Do not lie down right after eating.  Do not eat 1-2 hours before bed.  Do not drink beverages with caffeine. These include cola, coffee, cocoa, and tea.  Do not drink alcohol. General  instructions   Take over-the-counter and prescription medicines only as told by your health care provider.  Keep all follow-up visits as told by your health care provider. This is important. Contact a health care provider if:  Your symptoms are not controlled with medicines or lifestyle changes.  You are having trouble swallowing.  You have coughing or wheezing that will not go away. Get help right away if:  Your pain is getting worse.  Your pain spreads to your arms, neck, jaw, teeth, or back.  You have shortness of breath.  You sweat for no reason.  You feel sick to your stomach (nauseous) or you vomit.  You vomit blood.  You have bright red blood in your stools.  You have black, tarry stools. This information is not intended to replace advice given to you by your health care provider. Make sure you discuss any questions you have with your health care provider. Document Released: 06/28/2003 Document Revised: 03/31/2016 Document Reviewed: 03/31/2016 Elsevier Interactive Patient Education  2017 [employer].

## 2016-08-14 NOTE — Op Note (Signed)
[facility] Patient Name: Collin Coleman Procedure Date: 08/14/2016 10:43 AM MRN: [id number] Date of Birth: 08/10/1980 Attending MD: Norvel Richards , MD CSN: [id number] Age: 36 Admit Type: Outpatient Procedure:                Colonoscopy Indications:              Chronic diarrhea; history of Crohn's disease. Providers:                Norvel Richards, MD, Gwenlyn Fudge RN, RN,                            Randa Spike, Technician Referring MD:              Medicines:                Midazolam 2 mg IV, Meperidine 50 mg IV Complications:            No immediate complications. Estimated Blood Loss:     Estimated blood loss: none. Procedure:                Pre-Anesthesia Assessment:                           - Prior to the procedure, a History and Physical                            was performed, and patient medications and                            allergies were reviewed. The patient's tolerance of                            previous anesthesia was also reviewed. The risks                            and benefits of the procedure and the sedation                            options and risks were discussed with the patient.                            All questions were answered, and informed consent                            was obtained. Prior Anticoagulants: The patient has                            taken no previous anticoagulant or antiplatelet                            agents. ASA Grade Assessment: II - A patient with                            mild systemic disease. After reviewing the risks  and benefits, the patient was deemed in                            satisfactory condition to undergo the procedure.                           After obtaining informed consent, the colonoscope                            was passed under direct vision. Throughout the                            procedure, the patient's blood pressure, pulse, and                 oxygen saturations were monitored continuously. The                            Colonoscope was introduced through the anus and                            advanced to the the cecum, identified by                            appendiceal orifice and ileocecal valve. The                            terminal ileum, ileocecal valve, appendiceal                            orifice, and rectum were photographed. The terminal                            ileum, ileocecal valve, appendiceal orifice, and                            rectum were photographed. The entire colon was well                            visualized. The colonoscopy was performed without                            difficulty. The quality of the bowel preparation                            was adequate. The quality of the bowel preparation                            was adequate. Scope In: 10:47:54 AM Scope Out: 11:06:39 AM Scope Withdrawal Time: 0 hours 15 minutes 23 seconds  Total Procedure Duration: 0 hours 18 minutes 45 seconds  Findings:      The perianal and digital rectal examinations were normal. abnormal       appearing ileocecal valve. Marked stenosis of the TI lumen. Unable to       admit scope.  A 2 mm polyp was found in the ileocecal valve. The polyp was       pedunculated. The polyp was removed with a cold snare. Resection and       retrieval were complete. Estimated blood loss was minimal.      The remainder the rectum and colon appear normal. Impression:               - One 2 mm polyp at the ileocecal valve, removed                            with a cold snare. Resected and retrieved. abnormal                            TI status post biopsy Moderate Sedation:      Moderate (conscious) sedation was administered by the endoscopy nurse       and supervised by the endoscopist. The following parameters were       monitored: oxygen saturation, heart rate, blood pressure, respiratory       rate, EKG,  adequacy of pulmonary ventilation, and response to care.       Total physician intraservice time was 30 minutes. Recommendation:           - Patient has a contact number available for                            emergencies. The signs and symptoms of potential                            delayed complications were discussed with the                            patient. Return to normal activities tomorrow.                            Written discharge instructions were provided to the                            patient. Follow up on pathology. Proceed with a CTE                            to further evaluate small bowel. See EGD report.                           - Repeat colonoscopy date to be determined after                            pending pathology results are reviewed for                            surveillance.                           - Return to GI office in 6 weeks. Procedure Code(s):        --- Professional ---  [phone], Colonoscopy, flexible; with removal of                            tumor(s), polyp(s), or other lesion(s) by snare                            technique                           99152, Moderate sedation services provided by the                            same physician or other qualified health care                            professional performing the diagnostic or                            therapeutic service that the sedation supports,                            requiring the presence of an independent trained                            observer to assist in the monitoring of the                            patient's level of consciousness and physiological                            status; initial 15 minutes of intraservice time,                            patient age 53 years or older                           [phone], Moderate sedation services; each additional                            15 minutes intraservice time Diagnosis Code(s):         --- Professional ---                           D12.0, Benign neoplasm of cecum                           K52.9, Noninfective gastroenteritis and colitis,                            unspecified CPT copyright 2016 American Medical Association. All rights reserved. The codes documented in this report are preliminary and upon coder review may  be revised to meet current compliance requirements. Cristopher Estimable. Cadon Raczka, MD Norvel Richards, MD 08/14/2016 11:20:59 AM This report has been signed electronically. Number of Addenda: 0

## 2016-08-14 NOTE — Progress Notes (Signed)
Chief Complaint  Patient presents with  . Follow-up    1 month   Patient is here for follow-up. Unfortunately he is compared directly from having a colonoscopy, endoscopy. He is still drowsy. He is accompanied by his mother. I'm certain he'll have amnesia for anything that time. He wants to discuss his colonoscopy and endoscopy results. I encouraged him to discuss these with his gastroenterologist. I did tell him the colonoscopy was negative except for small polyp, pathology pending. I did tell him he had a hiatal hernia and showed him a picture of what that means. We briefly discussed management of hiatal hernia and continuing to take his PPI. He has no other complaints and is feeling well. His weight is stable.  Patient Active Problem List   Diagnosis Date Noted  . Hiatal hernia 08/14/2016  . NASH (nonalcoholic steatohepatitis) Aug 13, 2016  . Vitamin D deficiency August 13, 2016  . Family history of death due to heart problem at 19 years of age or younger 13-Aug-2016  . RUQ pain 07/07/2016  . Crohn's disease without complication ([place])   . Diarrhea     Outpatient Encounter Prescriptions as of 08/14/2016  Medication Sig  Protonix No facility-administered encounter medications on file as of 08/14/2016.     Allergies  Allergen Reactions  . Benadryl [Diphenhydramine] Palpitations    palpitations  . Pertussis Vaccines Other (See Comments)    Febrile seizure    Review of Systems  Constitutional: Negative for activity change, appetite change, fatigue and unexpected weight change.  HENT: Negative.   Eyes: Negative.   Respiratory: Negative.  Negative for cough and shortness of breath.   Cardiovascular: Negative.  Negative for chest pain.  Gastrointestinal: Negative.  Negative for blood in stool, constipation and diarrhea.  Psychiatric/Behavioral:       Patient is mildly drowsy   BP 126/86 (BP Location: Right Arm, Patient Position: Sitting, Cuff Size: Normal)   Pulse 84   Temp 98.5  F (36.9 C) (Temporal)   Resp 18   Ht 5\' 6"  (1.676 m)   Wt 178 lb 1.9 oz (80.8 kg)   SpO2 98%   BMI 28.75 kg/m   Physical Exam  Constitutional: He appears well-developed and well-nourished. No distress.  Mildly drowsy. laughs easily.  HENT:  Head: Normocephalic and atraumatic.  Mouth/Throat: Oropharynx is clear and moist.  Cardiovascular: Normal rate, regular rhythm and normal heart sounds.   Pulmonary/Chest: Effort normal and breath sounds normal.  Abdominal: Soft. Bowel sounds are normal. He exhibits no distension. There is no tenderness.  Neurological: He is alert.  Psychiatric: He has a normal mood and affect. His behavior is normal.    ASSESSMENT/PLAN:  1. Hiatal hernia    Patient Instructions  Hiatal Hernia A hiatal hernia occurs when part of the stomach slides above the muscle that separates the abdomen from the chest (diaphragm). A person can be born with a hiatal hernia (congenital), or it may develop over time. In almost all cases of hiatal hernia, only the top part of the stomach pushes through the diaphragm. Many people have a hiatal hernia with no symptoms. The larger the hernia, the more likely it is that you will have symptoms. In some cases, a hiatal hernia allows stomach acid to flow back into the tube that carries food from your mouth to your stomach (esophagus). This may cause heartburn symptoms. Severe heartburn symptoms may mean that you have developed a condition called gastroesophageal reflux disease (GERD). What are the causes? This condition is  caused by a weakness in the opening (hiatus) where the esophagus passes through the diaphragm to attach to the upper part of the stomach. A person may be born with a weakness in the hiatus, or a weakness can develop over time. What increases the risk? This condition is more likely to develop in:  Older people. Age is a major risk factor for a hiatal hernia, especially if you are over the age of 58.  Pregnant  women.  People who are overweight.  People who have frequent constipation. What are the signs or symptoms? Symptoms of this condition usually develop in the form of GERD symptoms. Symptoms include:  Heartburn.  Belching.  Indigestion.  Trouble swallowing.  Coughing or wheezing.  Sore throat.  Hoarseness.  Chest pain.  Nausea and vomiting. How is this diagnosed? This condition may be diagnosed during testing for GERD. Tests that may be done include:  X-rays of your stomach or chest.  An upper gastrointestinal (GI) series. This is an X-ray exam of your GI tract that is taken after you swallow a chalky liquid that shows up clearly on the X-ray.  Endoscopy. This is a procedure to look into your stomach using a thin, flexible tube that has a tiny camera and light on the end of it. How is this treated? This condition may be treated by:  Dietary and lifestyle changes to help reduce GERD symptoms.  Medicines. These may include:  Over-the-counter antacids.  Medicines that make your stomach empty more quickly.  Medicines that block the production of stomach acid (H2 blockers).  Stronger medicines to reduce stomach acid (proton pump inhibitors).  Surgery to repair the hernia, if other treatments are not helping. If you have no symptoms, you may not need treatment. Follow these instructions at home: Lifestyle and activity   Do not use any products that contain nicotine or tobacco, such as cigarettes and e-cigarettes. If you need help quitting, ask your health care provider.  Try to achieve and maintain a healthy body weight.  Avoid putting pressure on your abdomen. Anything that puts pressure on your abdomen increases the amount of acid that may be pushed up into your esophagus.  Avoid bending over, especially after eating.  Raise the head of your bed by putting blocks under the legs. This keeps your head and esophagus higher than your stomach.  Do not wear tight  clothing around your chest or stomach.  Try not to strain when having a bowel movement, when urinating, or when lifting heavy objects. Eating and drinking   Avoid foods that can worsen GERD symptoms. These may include:  Fatty foods, like fried foods.  Citrus fruits, like oranges or lemon.  Other foods and drinks that contain acid, like orange juice or tomatoes.  Spicy food.  Chocolate.  Eat frequent small meals instead of three large meals a day. This helps prevent your stomach from getting too full.  Eat slowly.  Do not lie down right after eating.  Do not eat 1-2 hours before bed.  Do not drink beverages with caffeine. These include cola, coffee, cocoa, and tea.  Do not drink alcohol. General instructions   Take over-the-counter and prescription medicines only as told by your health care provider.  Keep all follow-up visits as told by your health care provider. This is important. Contact a health care provider if:  Your symptoms are not controlled with medicines or lifestyle changes.  You are having trouble swallowing.  You have coughing or wheezing that will  not go away. Get help right away if:  Your pain is getting worse.  Your pain spreads to your arms, neck, jaw, teeth, or back.  You have shortness of breath.  You sweat for no reason.  You feel sick to your stomach (nauseous) or you vomit.  You vomit blood.  You have bright red blood in your stools.  You have black, tarry stools. This information is not intended to replace advice given to you by your health care provider. Make sure you discuss any questions you have with your health care provider. Document Released: 06/28/2003 Document Revised: 03/31/2016 Document Reviewed: 03/31/2016 Elsevier Interactive Patient Education  2017 Elsevier Inc.    Raylene Everts, MD

## 2016-08-14 NOTE — H&P (Signed)
@LOGO @   Primary Care Physician:  Raylene Everts, MD Primary Gastroenterologist:  Dr. Gala Romney  Pre-Procedure History & Physical: HPI:  Collin Coleman is a 36 y.o. male here for further evaluation right upper quadrant abdominal pain and diarrhea. History of Crohn's disease. No recent medication. Response pulsation with acute flare in GI symptoms. Here for EGD and colonoscopy. Patient denies dysphagia.   Past Medical History:  Diagnosis Date  . Allergy   . Crohn disease ([place])   . Hypertension     Past Surgical History:  Procedure Laterality Date  . None      Prior to Admission medications   Medication Sig Start Date End Date Taking? Authorizing Provider  Vitamin D, Ergocalciferol, (DRISDOL) 50000 units CAPS capsule Take 50,000 Units by mouth 2 (two) times a week.   Yes Historical Provider, MD  pantoprazole (PROTONIX) 40 MG tablet Take 1 tablet (40 mg total) by mouth daily. 30 minutes prior to breakfast Patient not taking: Reported on 08/12/2016 07/07/16   Annitta Needs, NP  promethazine (PHENERGAN) 25 MG tablet Take 1 tablet (25 mg total) by mouth every 6 (six) hours as needed for nausea or vomiting. Patient not taking: Reported on 08/12/2016 07/07/16   Annitta Needs, NP    Allergies as of 08/06/2016 - Review Complete 07/15/2016  Allergen Reaction Noted  . Benadryl [diphenhydramine] Palpitations 06/15/2016  . Pertussis vaccines Other (See Comments) 06/15/2016    Family History  Problem Relation Age of Onset  . Arthritis Mother   . Asthma Mother   . Depression Mother   . Heart disease Mother 70    CABG  . Hyperlipidemia Mother   . Hypertension Mother   . Stroke Mother   . Diabetes Father   . Heart disease Father 45    Heart attack  . Heart disease Maternal Grandmother   . Heart disease Paternal Grandfather 46  . Crohn's disease Neg Hx     no first degree relatives but states his great-great grandfather  . Colon cancer Neg Hx     Social History   Social History  .  Marital status: Single    Spouse name: N/A  . Number of children: 0  . Years of education: 14   Occupational History  . Science writer General   Social History Main Topics  . Smoking status: Never Smoker  . Smokeless tobacco: Never Used  . Alcohol use No  . Drug use: No  . Sexual activity: Not Currently   Other Topics Concern  . Not on file   Social History Narrative   Lives with Collin Coleman and her husband   Enjoy Geneticist, [employer]    Review of Systems: See HPI, otherwise negative ROS  Physical Exam: BP (!) 132/95   Pulse (!) 109   Temp 97.5 F (36.4 C) (Oral)   Resp 14   Ht 5\' 5"  (1.651 m)   Wt 175 lb (79.4 kg)   SpO2 96%   BMI 29.12 kg/m  General:   Alert,  Well-developed, well-nourished, pleasant and cooperative in NAD Neck:  Supple; no masses or thyromegaly. No significant cervical adenopathy. Lungs:  Clear throughout to auscultation.   No wheezes, crackles, or rhonchi. No acute distress. Heart:  Regular rate and rhythm; no murmurs, clicks, rubs,  or gallops. Abdomen: Non-distended, normal bowel sounds.  Soft and nontender without appreciable mass or hepatosplenomegaly.  Pulses:  Normal pulses noted. Extremities:  Without clubbing or edema.  Impression:  36 year old gentleman with a distant history of Crohn's disease on no chronic meds recent with recent nausea, vomiting, diarrhea and right upper quadrant abdominal pain. Plan for diagnostic EGD and colonoscopy.  Recommendations: Diagnostic EGD and colonoscopy per plan.  The risks, benefits, limitations, imponderables and alternatives regarding both EGD and colonoscopy have been reviewed with the patient. Questions have been answered. All parties agreeable.      Notice: This dictation was prepared with Dragon dictation along with smaller phrase technology. Any transcriptional errors that result from this process are unintentional and may not be corrected upon review.

## 2016-08-17 ENCOUNTER — Encounter: Payer: Self-pay | Admitting: Internal Medicine

## 2016-08-18 ENCOUNTER — Other Ambulatory Visit: Payer: Self-pay

## 2016-08-18 ENCOUNTER — Telehealth: Payer: Self-pay

## 2016-08-18 DIAGNOSIS — D120 Benign neoplasm of cecum: Secondary | ICD-10-CM

## 2016-08-18 DIAGNOSIS — Z8719 Personal history of other diseases of the digestive system: Secondary | ICD-10-CM

## 2016-08-18 NOTE — Telephone Encounter (Signed)
Dr. Gala Romney, please give diagnosis to use for CTE.

## 2016-08-18 NOTE — Telephone Encounter (Signed)
Abnormal terminal ileum on colonoscopy; history of Crohn's disease

## 2016-08-18 NOTE — Telephone Encounter (Signed)
Per RMR-  Send letter to patient.  Send copy of letter with path to referring provider and PCP.   Await CT E results.   Please schedule a follow-up appointment with extender in about 3-4 weeks.

## 2016-08-18 NOTE — Telephone Encounter (Signed)
R/S TO SOONER AND CALLED PATIENT AND LEFT A MESSAGE

## 2016-08-18 NOTE — Telephone Encounter (Signed)
Letter mailed to the pt.  Has pt been scheduled for CTE?

## 2016-08-18 NOTE — Telephone Encounter (Signed)
Called [employer] for PA for CT entero abd/pelvis with contrast. Case approved. PA# [id number], effective 08/18/16-10/17/16.

## 2016-08-18 NOTE — Telephone Encounter (Signed)
CTE scheduled for 09/03/16 at 9:00am, pt to arrive at 7:30am to start drinking contrast. NPO after midnight. Tried to call pt, no answer. LMOVM and informed pt of CTE being scheduled and instructions. Letter also mailed to pt.

## 2016-08-19 ENCOUNTER — Encounter (HOSPITAL_COMMUNITY): Payer: Self-pay | Admitting: Internal Medicine

## 2016-08-19 NOTE — Telephone Encounter (Signed)
Opened in error

## 2016-09-03 ENCOUNTER — Ambulatory Visit (HOSPITAL_COMMUNITY)
Admission: RE | Admit: 2016-09-03 | Discharge: 2016-09-03 | Disposition: A | Discharge status: Home / Self Care | Payer: BLUE CROSS/BLUE SHIELD | Source: Ambulatory Visit | Attending: Internal Medicine | Admitting: Internal Medicine

## 2016-09-03 DIAGNOSIS — K5000 Crohn's disease of small intestine without complications: Secondary | ICD-10-CM | POA: Insufficient documentation

## 2016-09-03 DIAGNOSIS — D120 Benign neoplasm of cecum: Secondary | ICD-10-CM | POA: Insufficient documentation

## 2016-09-03 DIAGNOSIS — Z8719 Personal history of other diseases of the digestive system: Secondary | ICD-10-CM

## 2016-09-03 MED ORDER — IOPAMIDOL (ISOVUE-300) INJECTION 61%: 100.0000 mL | Freq: Once | INTRAVENOUS | Status: DC | PRN

## 2016-09-03 MED ORDER — BARIUM SULFATE 0.1 % PO SUSP
ORAL | Status: AC
  Filled 2016-09-03: qty 3

## 2016-09-03 MED ORDER — IOPAMIDOL (ISOVUE-300) INJECTION 61%
125.0000 mL | Freq: Once | INTRAVENOUS | Status: AC | PRN
  Administered 2016-09-03: 125 mL via INTRAVENOUS

## 2016-09-05 NOTE — Progress Notes (Signed)
We need to get quantiferon gold assay and Hep B surface antigen, total hep B core antibody, Hep B surface antibody.

## 2016-09-09 ENCOUNTER — Other Ambulatory Visit: Payer: Self-pay

## 2016-09-09 ENCOUNTER — Telehealth: Payer: Self-pay

## 2016-09-09 ENCOUNTER — Other Ambulatory Visit: Payer: Self-pay | Admitting: Gastroenterology

## 2016-09-09 DIAGNOSIS — K5090 Crohn's disease, unspecified, without complications: Secondary | ICD-10-CM

## 2016-09-09 MED ORDER — PROMETHAZINE HCL 12.5 MG PO TABS: 12.5000 mg | ORAL_TABLET | Freq: Four times a day (QID) | ORAL | 0 refills | Status: DC | PRN

## 2016-09-09 NOTE — Progress Notes (Signed)
If he is willing to do blood work beforehand, that is great. If not, I can talk to him about it at office visit.

## 2016-09-09 NOTE — Telephone Encounter (Signed)
Pt is requesting refill on phenergan. Pt has ov on 09/19/16.

## 2016-09-13 LAB — HEPATITIS B CORE ANTIBODY, TOTAL: Hep B Core Total Ab: NONREACTIVE

## 2016-09-13 LAB — HEPATITIS B SURFACE ANTIBODY, QUANTITATIVE: Hepatitis B-Post: <5 m[IU]/mL

## 2016-09-13 LAB — HEPATITIS B SURFACE ANTIGEN: HEP B S AG: NEGATIVE

## 2016-09-16 LAB — QUANTIFERON TB GOLD ASSAY (BLOOD)
Interferon Gamma Release Assay: NEGATIVE
QUANTIFERON NIL VALUE: 0.02 [IU]/mL
QUANTIFERON TB AG MINUS NIL: 0 [IU]/mL

## 2016-09-17 NOTE — Progress Notes (Signed)
Needs Hep B vaccination. Negative TB assay. Will discuss at visit June 1.

## 2016-09-18 ENCOUNTER — Ambulatory Visit: Payer: BLUE CROSS/BLUE SHIELD | Admitting: Gastroenterology

## 2016-09-18 ENCOUNTER — Telehealth: Payer: Self-pay | Admitting: Gastroenterology

## 2016-09-18 ENCOUNTER — Encounter: Payer: Self-pay | Admitting: General Practice

## 2016-09-18 NOTE — Progress Notes (Signed)
lmom and sent results in My Chart

## 2016-09-18 NOTE — Telephone Encounter (Signed)
Pt called asking to speak with CM. I told him CM was on another line and he has OV with [place] in the morning. He had questions about mychart and would like for CM to call him back. [phone]

## 2016-09-18 NOTE — Telephone Encounter (Signed)
I spoke with the patient and we need to collect a $30 copayment tomorrow for his ov with Vic[facility] Males

## 2016-09-19 ENCOUNTER — Ambulatory Visit (INDEPENDENT_AMBULATORY_CARE_PROVIDER_SITE_OTHER): Payer: BLUE CROSS/BLUE SHIELD | Admitting: Gastroenterology

## 2016-09-19 ENCOUNTER — Encounter: Payer: Self-pay | Admitting: Gastroenterology

## 2016-09-19 VITALS — BP 142/90 | HR 64 | Temp 96.6°F | Ht 65.0 in | Wt 178.2 lb

## 2016-09-19 DIAGNOSIS — K50018 Crohn's disease of small intestine with other complication: Secondary | ICD-10-CM | POA: Diagnosis not present

## 2016-09-19 NOTE — Patient Instructions (Signed)
Please take the vaccination prescription to Dr. Meda Coffee.  We will start on Humira.  I will see you in 8 weeks!

## 2016-09-19 NOTE — Progress Notes (Signed)
Referring Provider: Raylene Everts, MD Primary Care Physician:  Raylene Everts, MD Primary GI: Dr. Gala Romney   Chief Complaint  Patient presents with  . Crohn's Disease    f/u, "good days/bad days"    HPI:   Collin Coleman is a 36 y.o. male presenting today with a history of Crohn's ileitis, diagnosed age 59 by Dr. Oletta Lamas, not on any therapy for quite some time. Recent colonoscopy completed with abnormal IC valve and marked stenosis of the TI lumen, unable to admit scope. Benign polyp removed from IC valve. EGD also completed with small hiatal hernia, Grade 1 esophageal varices of uncertain significance, otherwise normal. Known gallbladder polyp on US abdomen and fatty liver. CTE completed with chronic findings of Crohn's disease in TI with associated 3.5 cm stricture. Also suspected associated fistula/fistulas from TI to the cecum. Here to discuss long-term biologic therapy.   Protonix helpful with GERD. No rectal bleeding or abdominal pain. Stool is Bristol scale #5. Multiple questions today regarding need for therapy, oral agents, prednisone, side effects, pursual of therapy vs no therapy. Discussed for at least an hour the colonoscopy and CTE findings, along with recommended therapy. In preparation, he has completed Hep B serologies and quantiferon TB gold assay. Will need Hep B vaccinations. He has never had Hep A vaccinations, either.   Past Medical History:  Diagnosis Date  . Allergy   . Crohn disease ([place])   . Hiatal hernia 08/14/2016  . Hypertension     Past Surgical History:  Procedure Laterality Date  . BIOPSY  08/14/2016   Procedure: BIOPSY;  Surgeon: Daneil Dolin, MD;  [facility];  Service: Endoscopy;;  terminal ileum  . COLONOSCOPY N/A 08/14/2016   Dr. Gala Romney: perianal and digital rectal exams normal, abnormal-appearing IC valve, marked stenosis of the TI lumen, unable to admit scope. 2 mm polyp in IC valve that was pedunculated s/p removal.   .  ESOPHAGOGASTRODUODENOSCOPY N/A 08/14/2016   Dr. Gala Romney: LA Grade B esophagitis, small hiatal hernia, Grade 1 esophageal varices, normal duodenal bulb and second portion of duodenum, no specimens collected   . None    . POLYPECTOMY  08/14/2016   Procedure: POLYPECTOMY;  Surgeon: Daneil Dolin, MD;  [facility];  Service: Endoscopy;;  colon    Current Outpatient Prescriptions  Medication Sig Dispense Refill  . pantoprazole (PROTONIX) 40 MG tablet Take 40 mg by mouth daily.  1  . promethazine (PHENERGAN) 12.5 MG tablet Take 1 tablet (12.5 mg total) by mouth every 6 (six) hours as needed for nausea or vomiting. 30 tablet 0  . Vitamin D, Ergocalciferol, (DRISDOL) 50000 units CAPS capsule Take 50,000 Units by mouth 2 (two) times a week.     No current facility-administered medications for this visit.     Allergies as of 09/19/2016 - Review Complete 09/19/2016  Allergen Reaction Noted  . Benadryl [diphenhydramine] Palpitations 06/15/2016  . Pertussis vaccines Other (See Comments) 06/15/2016    Family History  Problem Relation Age of Onset  . Arthritis Mother   . Asthma Mother   . Depression Mother   . Heart disease Mother 64       CABG  . Hyperlipidemia Mother   . Hypertension Mother   . Stroke Mother   . Diabetes Father   . Heart disease Father 8       Heart attack  . Heart disease Maternal Grandmother   . Heart disease Paternal Grandfather 43  .  Crohn's disease Neg Hx        no first degree relatives but states his great-great grandfather  . Colon cancer Neg Hx     Social History   Social History  . Marital status: Single    Spouse name: N/A  . Number of children: 0  . Years of education: 14   Occupational History  . Science writer General   Social History Main Topics  . Smoking status: Never Smoker  . Smokeless tobacco: Never Used  . Alcohol use No  . Drug use: No  . Sexual activity: Not Currently   Other Topics Concern  . None    Social History Narrative   Lives with Aunt and her husband   Enjoy Geneticist, [employer]    Review of Systems: As mentioned in HPI   Physical Exam: BP (!) 142/90   Pulse 64   Temp (!) 96.6 F (35.9 C) (Oral)   Ht 5\' 5"  (1.651 m)   Wt 178 lb 3.2 oz (80.8 kg)   BMI 29.65 kg/m  General:   Alert and oriented. No distress noted. Pleasant and cooperative.  Head:  Normocephalic and atraumatic. Eyes:  Conjuctiva clear without scleral icterus. Mouth:  Oral mucosa pink and moist. Good dentition. No lesions. Abdomen:  +BS, soft, non-tender and non-distended. No rebound or guarding. No HSM or masses noted. Msk:  Symmetrical without gross deformities. Normal posture. Extremities:  Without edema. Neurologic:  Alert and  oriented x4;  grossly normal neurologically. Psych:  Alert and cooperative. Normal mood and affect.

## 2016-09-20 ENCOUNTER — Encounter: Payer: Self-pay | Admitting: Gastroenterology

## 2016-09-22 ENCOUNTER — Encounter: Payer: Self-pay | Admitting: Internal Medicine

## 2016-09-22 DIAGNOSIS — K5000 Crohn's disease of small intestine without complications: Secondary | ICD-10-CM | POA: Insufficient documentation

## 2016-09-22 NOTE — Progress Notes (Signed)
CC'ED TO PCP 

## 2016-09-22 NOTE — Assessment & Plan Note (Signed)
36 year old male with history of Crohn's ileitis and diagnosed at age 59, with most recent colonoscopy noting abnormal IC valve and marked stenosis of the TI lumen, unable to admit scope. Benign polyp removed from IC valve. CTE completed with chronic findings of Crohn's disease in TI with associated 3.5 cm stricture. Also suspected associated fistula/fistulas from TI to the cecum. He has not been on any therapy for quite some time. After long discussion, he is agreeable to biologic therapy in the way of Humira. Hep B serologies completed, and he will need Hep A and B vaccinations. TB negative. Will submit for Humira and follow closely in clinic.  As of note, he has a gallbladder polyp that will need ultrasound surveillance and Grade 1 esophageal varices of uncertain significance on EGD. Known fatty liver but nothing to suggest advanced chronic liver disease. Return in 8 weeks for close follow-up.

## 2016-10-08 ENCOUNTER — Ambulatory Visit: Payer: BLUE CROSS/BLUE SHIELD | Admitting: Gastroenterology

## 2016-10-24 ENCOUNTER — Telehealth: Payer: Self-pay | Admitting: Family Medicine

## 2016-10-24 ENCOUNTER — Other Ambulatory Visit: Payer: Self-pay | Admitting: Family Medicine

## 2016-10-24 DIAGNOSIS — K7581 Nonalcoholic steatohepatitis (NASH): Secondary | ICD-10-CM

## 2016-10-24 DIAGNOSIS — K5090 Crohn's disease, unspecified, without complications: Secondary | ICD-10-CM

## 2016-10-24 DIAGNOSIS — E559 Vitamin D deficiency, unspecified: Secondary | ICD-10-CM

## 2016-10-24 DIAGNOSIS — R1011 Right upper quadrant pain: Secondary | ICD-10-CM

## 2016-10-24 LAB — CBC WITH DIFFERENTIAL/PLATELET
BASOS PCT: 0 %
Basophils Absolute: 0 cells/uL (ref 0–200)
EOS PCT: 2 %
Eosinophils Absolute: 128 cells/uL (ref 15–500)
HEMATOCRIT: 46.5 % (ref 38.5–50.0)
Hemoglobin: 15.8 g/dL (ref 13.2–17.1)
LYMPHS ABS: 1344 {cells}/uL (ref 850–3900)
Lymphocytes Relative: 21 %
MCH: 30.3 pg (ref 27.0–33.0)
MCHC: 34 g/dL (ref 32.0–36.0)
MCV: 89.1 fL (ref 80.0–100.0)
MONO ABS: 704 {cells}/uL (ref 200–950)
MPV: 9.4 fL (ref 7.5–12.5)
Monocytes Relative: 11 %
NEUTROS ABS: 4224 {cells}/uL (ref 1500–7800)
Neutrophils Relative %: 66 %
Platelets: 243 10*3/uL (ref 140–400)
RBC: 5.22 MIL/uL (ref 4.20–5.80)
RDW: 13.3 % (ref 11.0–15.0)
WBC: 6.4 10*3/uL (ref 3.8–10.8)

## 2016-10-24 NOTE — Telephone Encounter (Signed)
Does he need this? Last noted k level was 3 27 18  = 4.0

## 2016-10-24 NOTE — Telephone Encounter (Signed)
Patient here asking is the lab order was ever put in for potassium check.

## 2016-10-25 LAB — COMPLETE METABOLIC PANEL WITH GFR
ALT: 15 U/L (ref 9–46)
AST: 17 U/L (ref 10–40)
Albumin: 4.4 g/dL (ref 3.6–5.1)
Alkaline Phosphatase: 46 U/L (ref 40–115)
BUN: 12 mg/dL (ref 7–25)
CALCIUM: 9.1 mg/dL (ref 8.6–10.3)
CHLORIDE: 105 mmol/L (ref 98–110)
CO2: 27 mmol/L (ref 20–31)
Creat: 0.99 mg/dL (ref 0.60–1.35)
GFR, Est African American: >89 mL/min (ref >=60)
Glucose, Bld: 66 mg/dL (ref 65–99)
POTASSIUM: 4.4 mmol/L (ref 3.5–5.3)
Sodium: 142 mmol/L (ref 135–146)
Total Bilirubin: 0.7 mg/dL (ref 0.2–1.2)
Total Protein: 6.4 g/dL (ref 6.1–8.1)

## 2016-10-25 LAB — VITAMIN D 25 HYDROXY (VIT D DEFICIENCY, FRACTURES): VIT D 25 HYDROXY: 43 ng/mL (ref 30–100)

## 2016-11-24 ENCOUNTER — Ambulatory Visit: Payer: BLUE CROSS/BLUE SHIELD | Admitting: Gastroenterology

## 2016-12-03 ENCOUNTER — Telehealth: Payer: Self-pay | Admitting: Internal Medicine

## 2016-12-03 NOTE — Telephone Encounter (Signed)
Letter mailed

## 2016-12-03 NOTE — Telephone Encounter (Signed)
Recall for ultrasound 

## 2016-12-17 ENCOUNTER — Other Ambulatory Visit: Payer: Self-pay

## 2016-12-17 DIAGNOSIS — K50119 Crohn's disease of large intestine with unspecified complications: Secondary | ICD-10-CM

## 2016-12-29 ENCOUNTER — Ambulatory Visit (HOSPITAL_COMMUNITY): Payer: BLUE CROSS/BLUE SHIELD

## 2016-12-29 ENCOUNTER — Ambulatory Visit (HOSPITAL_COMMUNITY)
Admission: RE | Admit: 2016-12-29 | Discharge: 2016-12-29 | Disposition: A | Discharge status: Home / Self Care | Payer: BLUE CROSS/BLUE SHIELD | Source: Ambulatory Visit | Attending: Gastroenterology | Admitting: Gastroenterology

## 2016-12-29 DIAGNOSIS — K50119 Crohn's disease of large intestine with unspecified complications: Secondary | ICD-10-CM | POA: Diagnosis not present

## 2016-12-29 DIAGNOSIS — K824 Cholesterolosis of gallbladder: Secondary | ICD-10-CM | POA: Diagnosis not present

## 2017-01-06 ENCOUNTER — Other Ambulatory Visit: Payer: Self-pay

## 2017-01-06 ENCOUNTER — Ambulatory Visit (INDEPENDENT_AMBULATORY_CARE_PROVIDER_SITE_OTHER): Payer: BLUE CROSS/BLUE SHIELD | Admitting: Internal Medicine

## 2017-01-06 ENCOUNTER — Telehealth: Payer: Self-pay

## 2017-01-06 ENCOUNTER — Encounter: Payer: Self-pay | Admitting: Internal Medicine

## 2017-01-06 VITALS — BP 131/86 | HR 76 | Temp 97.8°F | Ht 65.0 in | Wt 179.2 lb

## 2017-01-06 DIAGNOSIS — K50013 Crohn's disease of small intestine with fistula: Secondary | ICD-10-CM

## 2017-01-06 DIAGNOSIS — K219 Gastro-esophageal reflux disease without esophagitis: Secondary | ICD-10-CM

## 2017-01-06 DIAGNOSIS — K50119 Crohn's disease of large intestine with unspecified complications: Secondary | ICD-10-CM

## 2017-01-06 NOTE — Progress Notes (Signed)
Primary Care Physician:  Raylene Everts, MD Primary Gastroenterologist:  Dr. Gala Romney  Pre-Procedure History & Physical: HPI:  Collin Coleman is a 36 y.o. male here for follow-up long-standing ileocolonic Crohn's disease.. At the last office visit, Humira was offered as chronic treatment for ileocolonic Crohn's disease. 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 CT demonstrated 3.5 cm segment of stenosed terminal ileum without obstruction. Clinically, he is doing well no abdominal pain or rectal bleeding. He does have chronically loose stools. He does not take any nonsteroidal agents.  Patient has multiple questions about the possibility of balloon dilation of the TI stricture as other treatments aside from biologic therapy to manage his Crohn's disease. He does have mild arthralgias diffusely from time to time.  Reflux symptoms well controlled on Protonix 40 mg daily. He takes vitamin D/calcium supplement.  Gallbladder with a 5 mm polyp on recent ultrasound. Prior EGD demonstrated a reflux esophagitis and possibly Grade 1 esophageal varices.  Liver appeared normal on recent ultrasound.  Past Medical History:  Diagnosis Date  . Allergy   . Crohn disease ([place])   . Hiatal hernia 08/14/2016  . Hypertension     Past Surgical History:  Procedure Laterality Date  . BIOPSY  08/14/2016   Procedure: BIOPSY;  Surgeon: Daneil Dolin, MD;  [facility];  Service: Endoscopy;;  terminal ileum  . COLONOSCOPY N/A 08/14/2016   Dr. Gala Romney: perianal and digital rectal exams normal, abnormal-appearing IC valve, marked stenosis of the TI lumen, unable to admit scope. 2 mm polyp in IC valve that was pedunculated s/p removal.   . ESOPHAGOGASTRODUODENOSCOPY N/A 08/14/2016   Dr. Gala Romney: LA Grade B esophagitis, small hiatal hernia, Grade 1 esophageal varices, normal duodenal bulb and second portion of duodenum, no specimens collected   . None    . POLYPECTOMY  08/14/2016   Procedure:  POLYPECTOMY;  Surgeon: Daneil Dolin, MD;  [facility];  Service: Endoscopy;;  colon    Prior to Admission medications   Medication Sig Start Date End Date Taking? Authorizing Provider  pantoprazole (PROTONIX) 40 MG tablet Take 40 mg by mouth daily. 08/14/16  Yes [provider]  Vitamin D, Ergocalciferol, (DRISDOL) 50000 units CAPS capsule Take 50,000 Units by mouth 2 (two) times a week.   Yes [provider]  promethazine (PHENERGAN) 12.5 MG tablet Take 1 tablet (12.5 mg total) by mouth every 6 (six) hours as needed for nausea or vomiting. Patient not taking: Reported on 01/06/2017 09/09/16   Annitta Needs, NP    Allergies as of 01/06/2017 - Review Complete 01/06/2017  Allergen Reaction Noted  . Benadryl [diphenhydramine] Palpitations 06/15/2016  . Pertussis vaccines Other (See Comments) 06/15/2016    Family History  Problem Relation Age of Onset  . Arthritis Mother   . Asthma Mother   . Depression Mother   . Heart disease Mother 56       CABG  . Hyperlipidemia Mother   . Hypertension Mother   . Stroke Mother   . Diabetes Father   . Heart disease Father 103       Heart attack  . Heart disease Maternal Grandmother   . Heart disease Paternal Grandfather 40  . Crohn's disease Neg Hx        no first degree relatives but states his great-great grandfather  . Colon cancer Neg Hx     Social History   Social History  . Marital status: Single  Spouse name: N/A  . Number of children: 0  . Years of education: 14   Occupational History  . Science writer General   Social History Main Topics  . Smoking status: Never Smoker  . Smokeless tobacco: Never Used  . Alcohol use No  . Drug use: No  . Sexual activity: Not Currently   Other Topics Concern  . Not on file   Social History Narrative   Lives with [name] and her husband   Enjoy Geneticist, molecular [employer]    Review of Systems: See HPI, otherwise negative ROS  Physical  Exam: BP 131/86   Pulse 76   Temp 97.8 F (36.6 C) (Oral)   Ht '5\' 5"'  (1.651 m)   Wt 179 lb 3.2 oz (81.3 kg)   BMI 29.82 kg/m  General:   , well-nourished, pleasant and cooperative in NAD; accompanied by his mother Skin:  Intact without significant lesions or rashes. Neck:  Supple; no masses or thyromegaly. No significant cervical adenopathy. Lungs:  Clear throughout to auscultation.   No wheezes, crackles, or rhonchi. No acute distress. Heart:  Regular rate and rhythm; no murmurs, clicks, rubs,  or gallops. Abdomen: Nondistended. No surgical scars. Positive bowel sounds soft nontender without appreciable masses or organomegaly  Pulses:  Normal pulses noted. Extremities:  Without clubbing or edema.  Impression:  Pleasant 36 year old gentleman with ileocolonic Crohn's disease. Likely has a component of fibro-stenosing disease. However, he is devoid of any GI symptoms aside from chronically loose stools. He presents with a myriad of questions regarding risk and benefits of various treatment options. In particular, he is interested in the possibility of balloon dilating of his TI stricture if needed. Spent some time discussing the various treatment modalities with the patient. He wants as much information as possible regarding the pros and cons of beginning biologic therapy versus watchful waiting; again, he is virtually asymptomatic. He is understandably hesitant about going on immunosuppressive medication for the long-haul. Reflux symptoms well controlled on Protonix. Barely grade 1 varices seen a prior EGD. I do not think they were clinically significant and need no further evaluation.  Recommendations:  Continue Protonix 40 mg daily. Continue vitamin D calcium supplementation. Bone density study as indicated. He'll begin to get a second opinion regarding the evaluation management of his Crohn's disease. He really wants more information regarding  endoscopic and surgical bowel preserving  maneuvers in treating his Crohn's disease. To this end, we mutually agreed on sending him over to see one of the IBD experts at [facility] to get their opinion.  As far as 5 mm gallbladder polyp is concerned, would plan a repeat ultrasound in one year and if the lesion is stable, no further follow-up would be needed.            Notice: This dictation was prepared with Dragon dictation along with smaller phrase technology. Any transcriptional errors that result from this process are unintentional and may not be corrected upon review.

## 2017-01-06 NOTE — Patient Instructions (Addendum)
We will refer you to Dr. Koleen Distance or Dr. Nyoka Cowden for a second opinion regarding long term treatment of Crohn's disease.  Avoid NSAID drugs as much as possible  Office visit with [place] in 3 months

## 2017-01-06 NOTE — Telephone Encounter (Signed)
[facility] for referral for 2nd opinion regarding Crohn's Disease. Appt scheduled with Dr. Nyoka Cowden 01/15/17 at 1:00pm, pt to arrive at 12:45pm. [facility] will mail pt a new pt packet. Tried to call pt, no answer, LMOVM and informed him of appt. Referral info faxed to [facility].

## 2017-01-15 DIAGNOSIS — R109 Unspecified abdominal pain: Secondary | ICD-10-CM | POA: Insufficient documentation

## 2017-01-15 DIAGNOSIS — K50012 Crohn's disease of small intestine with intestinal obstruction: Secondary | ICD-10-CM | POA: Insufficient documentation

## 2017-02-25 ENCOUNTER — Ambulatory Visit: Payer: BLUE CROSS/BLUE SHIELD | Admitting: Family Medicine

## 2017-03-05 ENCOUNTER — Encounter: Payer: Self-pay | Admitting: Internal Medicine

## 2017-03-09 ENCOUNTER — Ambulatory Visit (INDEPENDENT_AMBULATORY_CARE_PROVIDER_SITE_OTHER): Payer: BLUE CROSS/BLUE SHIELD | Admitting: Family Medicine

## 2017-03-09 ENCOUNTER — Encounter: Payer: Self-pay | Admitting: Family Medicine

## 2017-03-09 ENCOUNTER — Other Ambulatory Visit: Payer: Self-pay

## 2017-03-09 VITALS — BP 126/86 | HR 76 | Temp 98.7°F | Resp 18 | Ht 65.0 in | Wt 178.0 lb

## 2017-03-09 DIAGNOSIS — K5090 Crohn's disease, unspecified, without complications: Secondary | ICD-10-CM

## 2017-03-09 DIAGNOSIS — E559 Vitamin D deficiency, unspecified: Secondary | ICD-10-CM

## 2017-03-09 DIAGNOSIS — K7581 Nonalcoholic steatohepatitis (NASH): Secondary | ICD-10-CM | POA: Diagnosis not present

## 2017-03-09 DIAGNOSIS — Z23 Encounter for immunization: Secondary | ICD-10-CM

## 2017-03-09 MED ORDER — VITAMIN D (ERGOCALCIFEROL) 1.25 MG (50000 UNIT) PO CAPS: 50000.0000 [IU] | ORAL_CAPSULE | ORAL | 10 refills | Status: AC

## 2017-03-09 MED ORDER — PROMETHAZINE HCL 12.5 MG PO TABS: 12.5000 mg | ORAL_TABLET | Freq: Four times a day (QID) | ORAL | 1 refills | Status: AC | PRN

## 2017-03-09 NOTE — Progress Notes (Signed)
Chief Complaint  Patient presents with  . Follow-up    6 month  Here for routine follow-up.  Requests refill of his vitamin D.  States he takes 50,000 units twice a week.  His last vitamin D level was 43 on that dose.  I told him that is a high dose, and I would like to check another vitamin D level. Patient has known Crohn's disease.  He had an MRI of his abdomen last month.  He was seen at [place] Dr. Nyoka Cowden.  It did show that he had thickening of the terminal ileum and an approximate 4-5 cm segment.  Reviewed his recent MRI report with him. He is on a restricted diet.  He has very little symptoms.  His weight is stable. He does have a local gastroenterologist as well. He got his flu shot. Health maintenance is up-to-date.   Patient Active Problem List   Diagnosis Date Noted  . Abdominal wall pain 01/15/2017  . Crohn's disease of small intestine with intestinal obstruction ([place]) 01/15/2017  . Terminal ileitis of small intestine ([place]) 09/22/2016  . Hiatal hernia 08/14/2016  . NASH (nonalcoholic steatohepatitis) 08/05/16  . Vitamin D deficiency 05-Aug-2016  . Family history of death due to heart problem at 8 years of age or younger 08-05-16  . RUQ pain 07/07/2016  . Crohn's disease without complication ([place])   . Diarrhea     Outpatient Encounter Medications as of 03/09/2017  Medication Sig  . baclofen (LIORESAL) 10 MG tablet Take 5-10 mg by mouth.  . pantoprazole (PROTONIX) 40 MG tablet Take 40 mg by mouth daily.  . promethazine (PHENERGAN) 12.5 MG tablet Take 1 tablet (12.5 mg total) every 6 (six) hours as needed by mouth for nausea or vomiting.  . Vitamin D, Ergocalciferol, (DRISDOL) 50000 units CAPS capsule Take 1 capsule (50,000 Units total) 2 (two) times a week by mouth.   No facility-administered encounter medications on file as of 03/09/2017.     Allergies  Allergen Reactions  . Benadryl [Diphenhydramine] Palpitations    palpitations  . Pertussis Vaccines  Other (See Comments)    Febrile seizure    Review of Systems  Constitutional: Negative for activity change, appetite change, fatigue, fever and unexpected weight change.  HENT: Negative for congestion and dental problem.   Eyes: Negative for photophobia and visual disturbance.  Respiratory: Negative for cough and shortness of breath.   Cardiovascular: Negative for chest pain, palpitations and leg swelling.  Gastrointestinal: Positive for abdominal pain and nausea. Negative for abdominal distention.       Recurring problems with nausea, takes as needed Phenergan,  periodic abdominal wall pain.  Genitourinary: Negative for difficulty urinating.  Musculoskeletal: Negative for arthralgias and back pain.  Skin: Negative for color change and pallor.  Neurological: Negative for light-headedness and headaches.  Psychiatric/Behavioral: Negative for dysphoric mood and sleep disturbance. The patient is not nervous/anxious.     BP 126/86 (BP Location: Left Arm, Patient Position: Sitting, Cuff Size: Normal)   Pulse 76   Temp 98.7 F (37.1 C) (Temporal)   Resp 18   Ht 5\' 5"  (1.651 m)   Wt 178 lb 0.6 oz (80.8 kg)   SpO2 98%   BMI 29.63 kg/m   Physical Exam  Constitutional: He is oriented to person, place, and time. He appears well-developed and well-nourished.  HENT:  Head: Normocephalic and atraumatic.  Mouth/Throat: Oropharynx is clear and moist.  Eyes: Conjunctivae are normal. Pupils are equal, round, and reactive to  light.  Neck: Normal range of motion. Neck supple. No thyromegaly present.  Cardiovascular: Normal rate, regular rhythm and normal heart sounds.  Pulmonary/Chest: Effort normal and breath sounds normal. No respiratory distress.  Abdominal: Soft. Bowel sounds are normal. There is tenderness.  Minimal tenderness right upper quadrant. No guarding or rebound. No palpable mass. No organomegaly.  Bowel sounds active  Musculoskeletal: Normal range of motion. He exhibits no edema.    Lymphadenopathy:    He has no cervical adenopathy.  Neurological: He is alert and oriented to person, place, and time. He displays normal reflexes.  Gait normal  Skin: Skin is warm and dry.  Psychiatric: He has a normal mood and affect. His behavior is normal. Thought content normal.  Nursing note and vitals reviewed.   ASSESSMENT/PLAN:  1. Crohn's disease without complication, unspecified gastrointestinal tract location [facility]) Currently under control.  Sees Dr. Sydell Axon locally, and Dr. Nyoka Cowden at [place] for specialty care - TSH - BASIC METABOLIC PANEL WITH GFR - CBC  2. NASH (nonalcoholic steatohepatitis) Overweight  3. Vitamin D deficiency By history - VITAMIN D 25 Hydroxy (Vit-D Deficiency, Fractures)  4. Need for tetanus booster Overdue. - Td : Tetanus/diphtheria >7yo Preservative  free   Patient Instructions  Continue current diet and supplements Need labs and follow up in Feb      Raylene Everts, MD

## 2017-03-09 NOTE — Patient Instructions (Signed)
Continue current diet and supplements Need labs and follow up in Feb

## 2017-05-06 ENCOUNTER — Other Ambulatory Visit: Payer: Self-pay | Admitting: Internal Medicine

## 2017-06-09 ENCOUNTER — Encounter: Payer: BLUE CROSS/BLUE SHIELD | Admitting: Family Medicine

## 2017-06-25 ENCOUNTER — Ambulatory Visit (INDEPENDENT_AMBULATORY_CARE_PROVIDER_SITE_OTHER): Payer: BLUE CROSS/BLUE SHIELD | Admitting: Family Medicine

## 2017-06-25 ENCOUNTER — Encounter: Payer: Self-pay | Admitting: Family Medicine

## 2017-06-25 VITALS — BP 122/78 | HR 75 | Temp 98.7°F | Ht 65.0 in | Wt 178.5 lb

## 2017-06-25 DIAGNOSIS — Z23 Encounter for immunization: Secondary | ICD-10-CM

## 2017-06-25 DIAGNOSIS — Z0000 Encounter for general adult medical examination without abnormal findings: Secondary | ICD-10-CM

## 2017-06-25 MED ORDER — PNEUMOCOCCAL 13-VAL CONJ VACC IM SUSP
0.5000 mL | Freq: Once | INTRAMUSCULAR | Status: AC
  Administered 2017-06-25: 0.5 mL via INTRAMUSCULAR

## 2017-06-25 NOTE — Progress Notes (Signed)
Chief Complaint  Patient presents with  . Annual Exam   Patient is here for his annual examination. He is eating well, with his food allergies and his Crohn's disease.  No diarrhea or blood in his bowels. He is compliant with his visits to gastroenterology.  He is compliant with the medications prescribed. 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 His immunizations are up-to-date, however he has not had the Pneumovax or prove Prevnar.  I would give him a Prevnar today and a Pneumovax in a year He does not need any other cancer screenings at this time.  He is 37.  He does not smoke.  He does not drink.  Patient Active Problem List   Diagnosis Date Noted  . Abdominal wall pain 01/15/2017  . Crohn's disease of small intestine with intestinal obstruction ([place]) 01/15/2017  . Terminal ileitis of small intestine ([place]) 09/22/2016  . Hiatal hernia 08/14/2016  . NASH (nonalcoholic steatohepatitis) 2016/08/05  . Vitamin D deficiency 2016-08-05  . Family history of death due to heart problem at 73 years of age or younger 08-05-16  . RUQ pain 07/07/2016  . Crohn's disease without complication ([place])   . Diarrhea     Outpatient Encounter Medications as of 06/25/2017  Medication Sig  . baclofen (LIORESAL) 10 MG tablet Take 10 mg by mouth 3 (three) times daily as needed.  . budesonide (ENTOCORT EC) 3 MG 24 hr capsule Take by mouth.  . pantoprazole (PROTONIX) 40 MG tablet TAKE 1 TABLET BY MOUTH TWICE A DAY  . promethazine (PHENERGAN) 12.5 MG tablet Take 1 tablet (12.5 mg total) every 6 (six) hours as needed by mouth for nausea or vomiting.  . Vitamin D, Ergocalciferol, (DRISDOL) 50000 units CAPS capsule Take 1 capsule (50,000 Units total) 2 (two) times a week by mouth.   No facility-administered encounter medications on file as of 06/25/2017.     Allergies  Allergen Reactions  . Benadryl [Diphenhydramine] Palpitations    palpitations   . Pertussis Vaccines Other (See Comments)    Febrile seizure    Review of Systems  Constitutional: Negative for activity change, appetite change, fatigue, fever and unexpected weight change.  HENT: Negative for congestion and dental problem.   Eyes: Negative for photophobia and visual disturbance.  Respiratory: Negative for cough and shortness of breath.   Cardiovascular: Negative for chest pain, palpitations and leg swelling.  Gastrointestinal: Positive for abdominal pain and nausea. Negative for abdominal distention.       Recurring problems with nausea, takes as needed Phenergan,  periodic abdominal wall pain.  Genitourinary: Negative for difficulty urinating.  Musculoskeletal: Negative for arthralgias and back pain.  Skin: Negative for color change and pallor.  Neurological: Negative for light-headedness and headaches.  Psychiatric/Behavioral: Negative for dysphoric mood and sleep disturbance. The patient is not nervous/anxious.     BP 122/78 (BP Location: Left Arm, Patient Position: Sitting, Cuff Size: Normal)   Pulse 75   Temp 98.7 F (37.1 C) (Temporal)   Ht 5\' 5"  (1.651 m)   Wt 178 lb 8 oz (81 kg)   SpO2 100%   BMI 29.70 kg/m   Physical Exam  BP 122/78 (BP Location: Left Arm, Patient Position: Sitting, Cuff Size: Normal)   Pulse 75   Temp 98.7 F (37.1 C) (Temporal)   Ht 5\' 5"  (1.651 m)   Wt 178 lb 8 oz (81 kg)   SpO2 100%  BMI 29.70 kg/m   General Appearance:    Alert, cooperative, no distress, appears stated age  Head:    Normocephalic, without obvious abnormality, atraumatic  Eyes:    PERRL, conjunctiva/corneas clear, EOM's intact, fundi    benign, both eyes       Ears:    Normal TM's and external ear canals, both ears  Nose:   Nares normal, septum midline, mucosa normal, no drainage   or sinus tenderness  Throat:   Lips, mucosa, and tongue normal; teeth and gums normal  Neck:   Supple, symmetrical, trachea midline, no adenopathy;       thyroid:  No  enlargement/tenderness/nodules; no carotid   bruit or JVD  Back:     Symmetric, no curvature, ROM normal, no CVA tenderness  Lungs:     Clear to auscultation bilaterally, respirations unlabored  Chest wall:    No tenderness or deformity  Heart:    Regular rate and rhythm, S1 and S2 normal, no murmur, rub   or gallop  Abdomen:     Soft, non-tender, bowel sounds active all four quadrants,    no masses, no organomegaly  Genitalia:    Normal male without lesion, discharge or tenderness  Extremities:   Extremities normal, atraumatic, no cyanosis or edema  Pulses:   2+ and symmetric all extremities  Skin:   Skin color, texture, turgor normal, no rashes or lesions  Lymph nodes:   Cervical, supraclavicular, and axillary nodes normal  Neurologic:   Normal strength, sensation and reflexes      throughout    ASSESSMENT/PLAN:  1. Annual physical exam No unexpected findings  2. Need for pneumococcal vaccination Given - pneumococcal 13-valent conjugate vaccine (PREVNAR 13) injection 0.5 mL   Patient Instructions  No change in medicine Continue under care of GI specialty  Need lab testing today I will send you a letter with your test results.  If there is anything of concern, we will call right away.  prevnar today Flu shot each fall  Yearly follow up We will contact you about appointment   Raylene Everts, MD

## 2017-06-25 NOTE — Patient Instructions (Signed)
No change in medicine Continue under care of GI specialty  Need lab testing today I will send you a letter with your test results.  If there is anything of concern, we will call right away.  prevnar today Flu shot each fall  Yearly follow up We will contact you about appointment

## 2017-06-26 LAB — BASIC METABOLIC PANEL WITH GFR
BUN: 11 mg/dL (ref 7–25)
CALCIUM: 9.1 mg/dL (ref 8.6–10.3)
CO2: 30 mmol/L (ref 20–32)
CREATININE: 1.31 mg/dL (ref 0.60–1.35)
Chloride: 102 mmol/L (ref 98–110)
GFR, EST AFRICAN AMERICAN: 79 mL/min/{1.73_m2} (ref >=60)
GFR, Est Non African American: 68 mL/min/{1.73_m2} (ref >=60)
Glucose, Bld: 83 mg/dL (ref 65–139)
Potassium: 4.3 mmol/L (ref 3.5–5.3)
Sodium: 141 mmol/L (ref 135–146)

## 2017-06-26 LAB — CBC
HCT: 48.8 % (ref 38.5–50.0)
Hemoglobin: 17.4 g/dL — ABNORMAL HIGH (ref 13.2–17.1)
MCH: 31.1 pg (ref 27.0–33.0)
MCHC: 35.7 g/dL (ref 32.0–36.0)
MCV: 87.3 fL (ref 80.0–100.0)
MPV: 9.1 fL (ref 7.5–12.5)
Platelets: 264 10*3/uL (ref 140–400)
RBC: 5.59 10*6/uL (ref 4.20–5.80)
RDW: 12.7 % (ref 11.0–15.0)
WBC: 8.2 10*3/uL (ref 3.8–10.8)

## 2017-06-26 LAB — VITAMIN D 25 HYDROXY (VIT D DEFICIENCY, FRACTURES): VIT D 25 HYDROXY: 21 ng/mL — AB (ref 30–100)

## 2017-06-26 LAB — TSH: TSH: 1.72 m[IU]/L (ref 0.40–4.50)

## 2017-06-29 ENCOUNTER — Encounter: Payer: Self-pay | Admitting: Family Medicine

## 2018-08-17 IMAGING — CT CT ENTEROGRAPHY (ABD-PELV W/ CM)
2 of 5 series · 16 of 46 positions shown, 18 images · IV contrast (Isovue)
Comparison: 06/16/2016.

CLINICAL DATA: Abnormal terminal ileum on colonoscopy, history of
Crohn disease. Benign neoplasm is cecum.

EXAM:
CT ABDOMEN AND PELVIS WITH CONTRAST (ENTEROGRAPHY)
TECHNIQUE: Multidetector CT of the abdomen and pelvis during bolus
administration of intravenous contrast. Negative oral contrast was
given.
CONTRAST:  125mL QWQQMZ-SDD IOPAMIDOL (QWQQMZ-SDD) INJECTION 61%

[Series 3: entero thins · axial · 0.71mm/px · z∈[+943,+1343]mm · 13 of 222 slices shown, 15 images]
[im 11/222  soft-tissue]
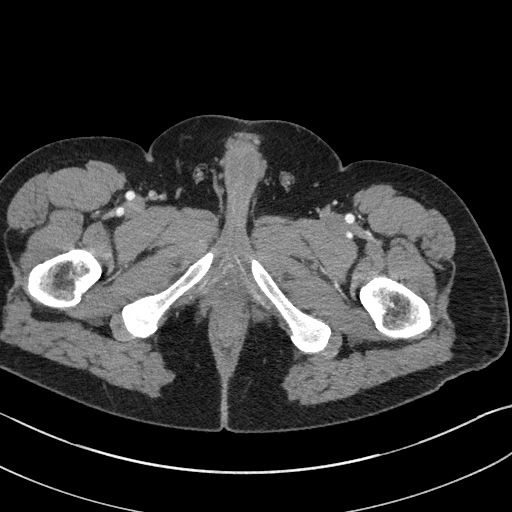
[im 11/222  bone]
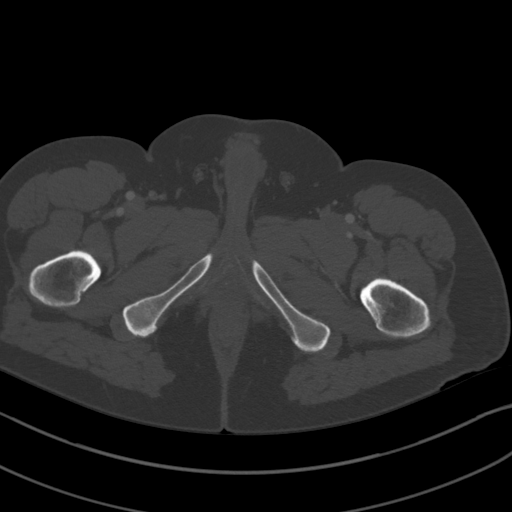
[im 32/222  soft-tissue]
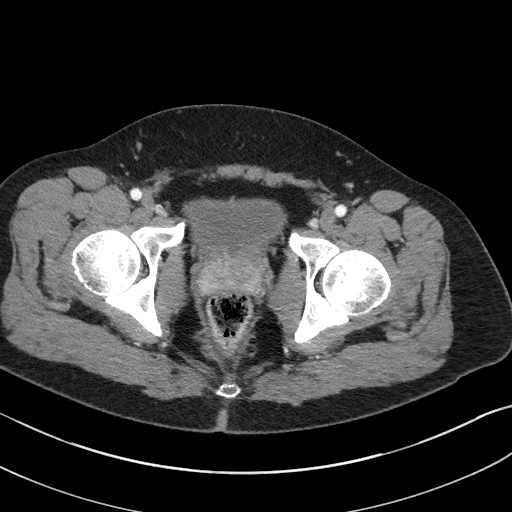
[im 43/222  soft-tissue]
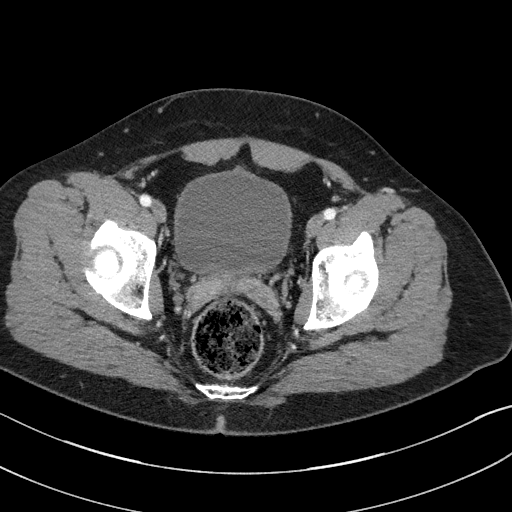
[im 64/222  soft-tissue]
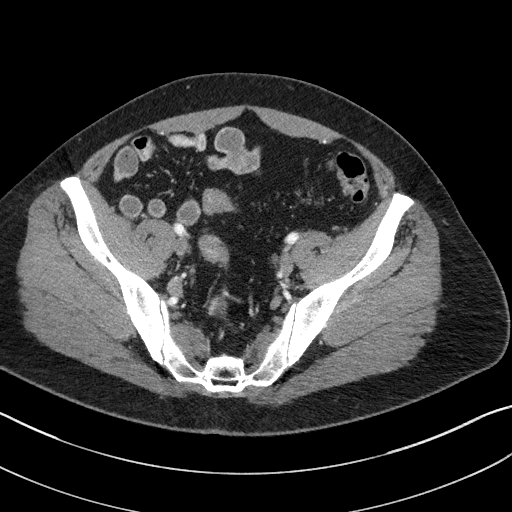
[im 74/222  soft-tissue]
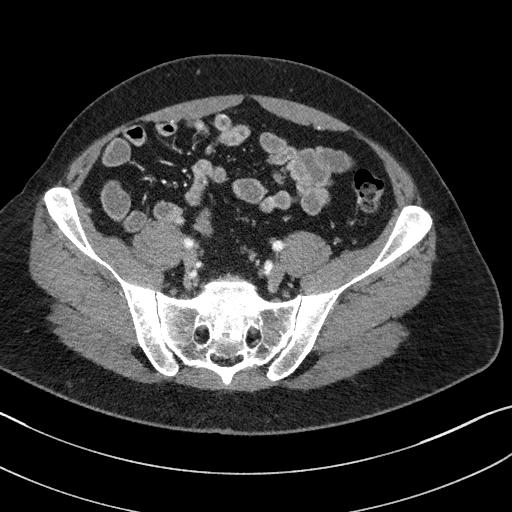
[im 95/222  soft-tissue]
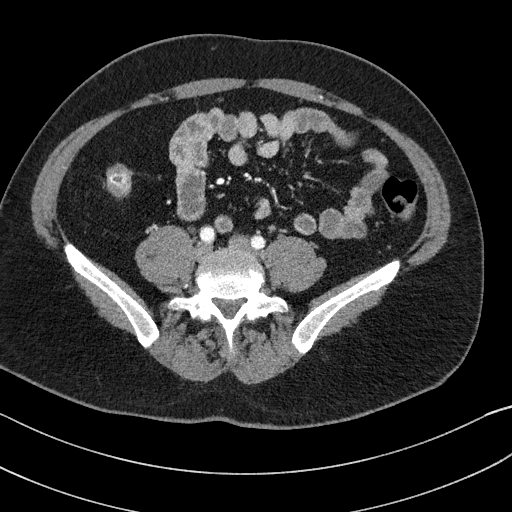
[im 116/222  soft-tissue]
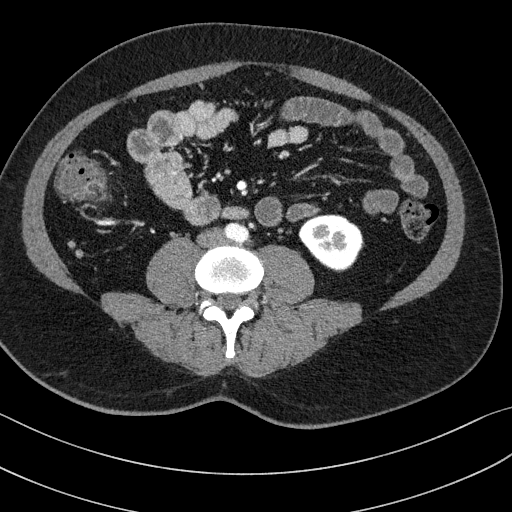
[im 127/222  soft-tissue]
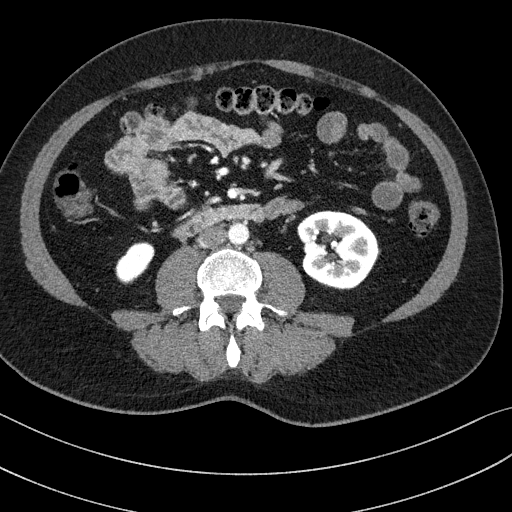
[im 148/222  soft-tissue]
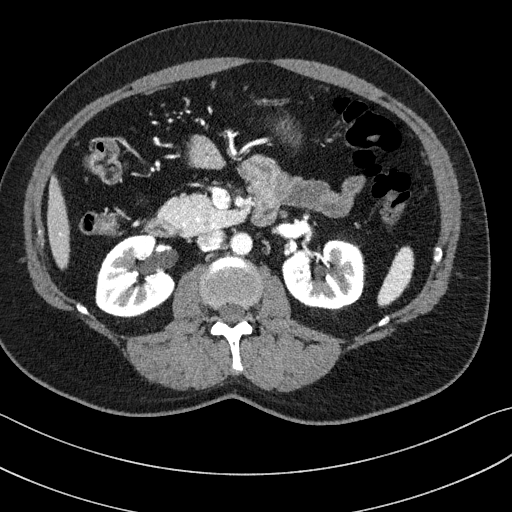
[im 148/222  bone]
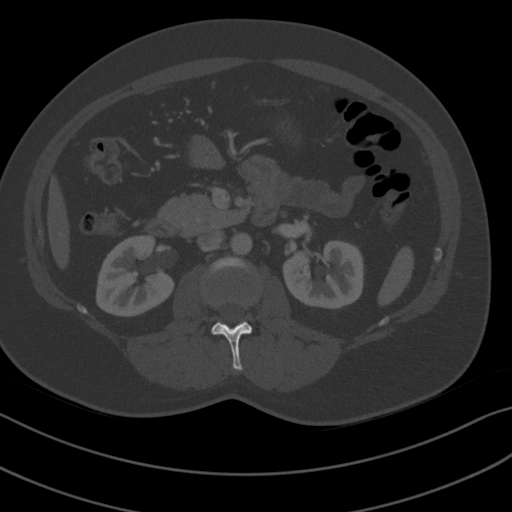
[im 158/222  soft-tissue]
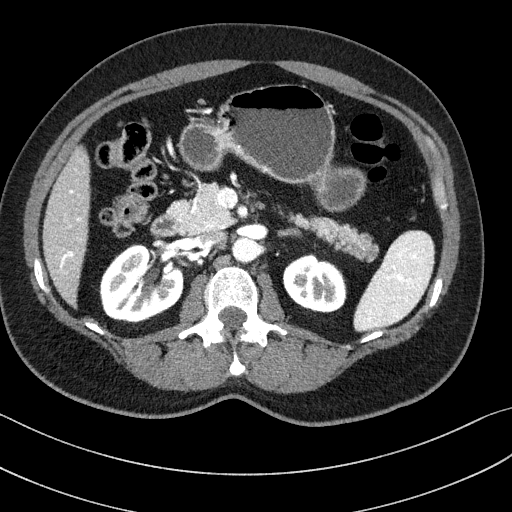
[im 179/222  soft-tissue]
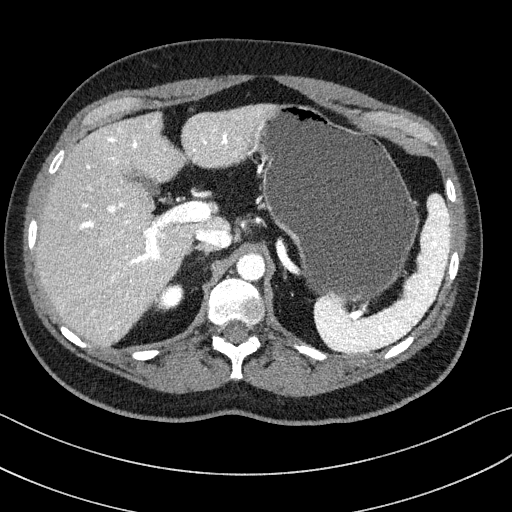
[im 190/222  soft-tissue]
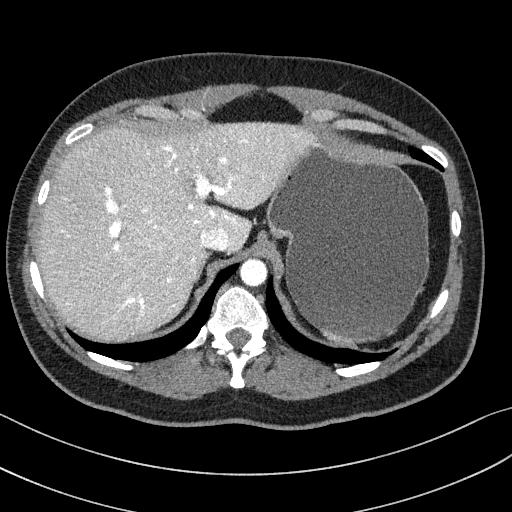
[im 211/222  soft-tissue]
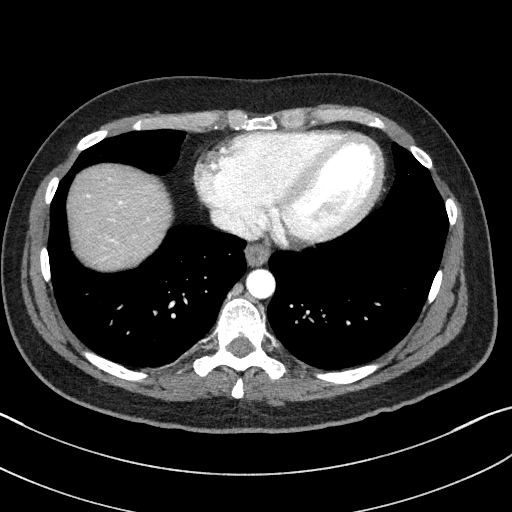

[Series 6: coronal · coronal · 0.78mm/px · 3 of 100 slices shown]
[im 34/100  soft-tissue]
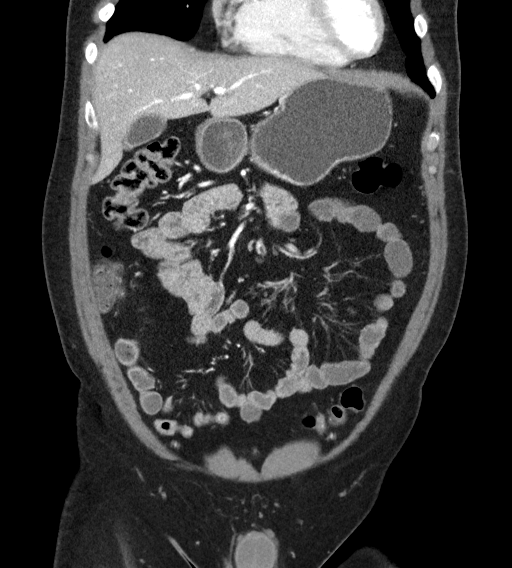
[im 45/100  soft-tissue]
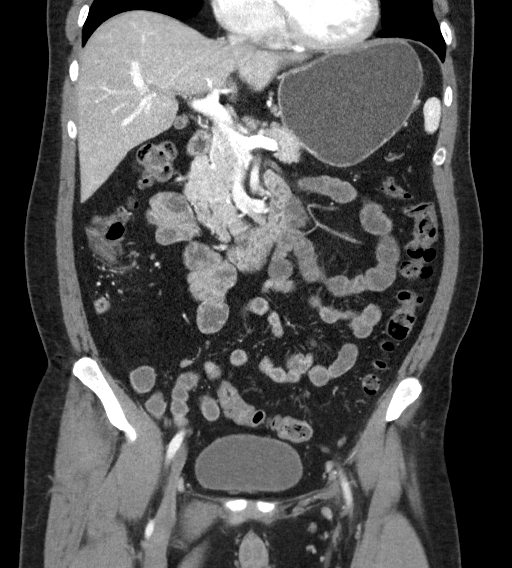
[im 56/100  soft-tissue]
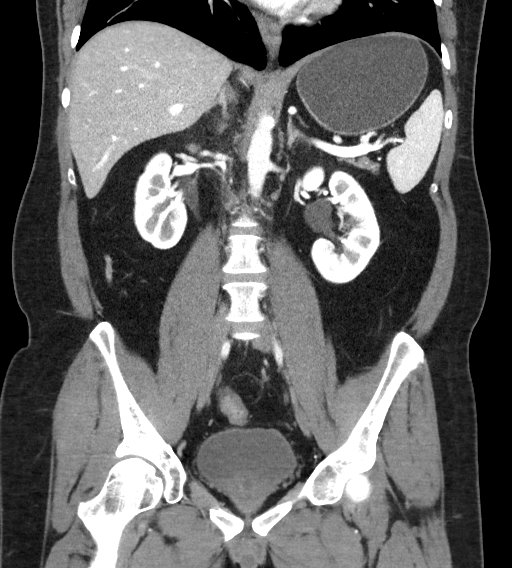

[16 of 46 positions shown; findings below may reference images not displayed]

FINDINGS: Lower chest: Lung bases are clear. Heart size normal. No pericardial
or pleural effusion.

Hepatobiliary: Liver and gallbladder are unremarkable. No biliary
ductal dilatation.

Pancreas: Negative.

Spleen: Negative.

Adrenals/Urinary Tract: Adrenal glands and right kidney are
unremarkable. Subcentimeter low-attenuation lesion in the lower pole
left kidney is too small to definitively characterize but
statistically, a cyst is most likely. Ureters are decompressed.
Bladder is grossly unremarkable.

Stomach/Bowel: Stomach and majority of the small bowel are
unremarkable. Persistent thickening and luminal narrowing involving
the terminal ileum (series 2, images 46-54), without adjacent
inflammatory stranding. Segmental narrowing measures approximately
3.5 cm (sagittal image 26). 2 suspected soft tissue tracts are seen
from the terminal ileum to the cecum (series 3, images 116 and 120).
Appendix and colon are unremarkable.

Vascular/Lymphatic: Vascular structures are unremarkable. No
pathologically enlarged lymph nodes. Ileocolic mesenteric lymph
nodes are subcentimeter in short axis size.

Reproductive: Prostate is visualized.

Other: No free fluid. Mesenteries and peritoneum are otherwise
unremarkable.

Musculoskeletal: No worrisome lytic or sclerotic follow-up lesions.
Sacroiliac joints are patent.
IMPRESSION: Chronic findings of Crohn disease in the terminal ileum with an
associated 3.5 cm stricture. Suspect an associated fistula or
fistulas from the terminal ileum to the cecum.
